# Patient Record
Sex: Female | Born: 1940 | Race: White | Hispanic: No | Marital: Married | Smoking: Never smoker
Health system: Southern US, Community
[De-identification: ages and names within clinical notes are randomized; demographics above are authoritative.]

## PROBLEM LIST (undated history)

## (undated) DIAGNOSIS — E118 Type 2 diabetes mellitus with unspecified complications: Secondary | ICD-10-CM

## (undated) DIAGNOSIS — J9610 Chronic respiratory failure, unspecified whether with hypoxia or hypercapnia: Secondary | ICD-10-CM

## (undated) DIAGNOSIS — I10 Essential (primary) hypertension: Secondary | ICD-10-CM

## (undated) DIAGNOSIS — E785 Hyperlipidemia, unspecified: Secondary | ICD-10-CM

## (undated) DIAGNOSIS — I4891 Unspecified atrial fibrillation: Secondary | ICD-10-CM

## (undated) HISTORY — PX: VAGINAL PROLAPSE REPAIR: SHX830

## (undated) HISTORY — PX: BACK SURGERY: SHX140

## (undated) HISTORY — PX: ANOMALOUS PULMONARY VENOUS RETURN REPAIR, TOTAL: SHX1156

---

## 2020-03-26 ENCOUNTER — Observation Stay (HOSPITAL_COMMUNITY): Payer: Medicare Other

## 2020-03-26 ENCOUNTER — Emergency Department (HOSPITAL_COMMUNITY): Payer: Medicare Other

## 2020-03-26 ENCOUNTER — Encounter (HOSPITAL_COMMUNITY): Payer: Self-pay | Admitting: Internal Medicine

## 2020-03-26 ENCOUNTER — Other Ambulatory Visit: Payer: Self-pay

## 2020-03-26 ENCOUNTER — Inpatient Hospital Stay (HOSPITAL_COMMUNITY)
Admission: EM | Admit: 2020-03-26 | Discharge: 2020-03-28 | DRG: 069 | Disposition: A | Discharge status: Home Health | Payer: Medicare Other | Attending: Internal Medicine | Admitting: Internal Medicine

## 2020-03-26 DIAGNOSIS — Z20822 Contact with and (suspected) exposure to covid-19: Secondary | ICD-10-CM | POA: Diagnosis present

## 2020-03-26 DIAGNOSIS — Z7982 Long term (current) use of aspirin: Secondary | ICD-10-CM

## 2020-03-26 DIAGNOSIS — Z880 Allergy status to penicillin: Secondary | ICD-10-CM

## 2020-03-26 DIAGNOSIS — B9620 Unspecified Escherichia coli [E. coli] as the cause of diseases classified elsewhere: Secondary | ICD-10-CM | POA: Diagnosis present

## 2020-03-26 DIAGNOSIS — G459 Transient cerebral ischemic attack, unspecified: Secondary | ICD-10-CM | POA: Diagnosis not present

## 2020-03-26 DIAGNOSIS — Z79899 Other long term (current) drug therapy: Secondary | ICD-10-CM

## 2020-03-26 DIAGNOSIS — I639 Cerebral infarction, unspecified: Secondary | ICD-10-CM | POA: Diagnosis present

## 2020-03-26 DIAGNOSIS — I340 Nonrheumatic mitral (valve) insufficiency: Secondary | ICD-10-CM

## 2020-03-26 DIAGNOSIS — Z833 Family history of diabetes mellitus: Secondary | ICD-10-CM

## 2020-03-26 DIAGNOSIS — R911 Solitary pulmonary nodule: Secondary | ICD-10-CM | POA: Diagnosis present

## 2020-03-26 DIAGNOSIS — I482 Chronic atrial fibrillation, unspecified: Secondary | ICD-10-CM | POA: Diagnosis not present

## 2020-03-26 DIAGNOSIS — Z888 Allergy status to other drugs, medicaments and biological substances status: Secondary | ICD-10-CM

## 2020-03-26 DIAGNOSIS — Z8249 Family history of ischemic heart disease and other diseases of the circulatory system: Secondary | ICD-10-CM

## 2020-03-26 DIAGNOSIS — R791 Abnormal coagulation profile: Secondary | ICD-10-CM | POA: Diagnosis not present

## 2020-03-26 DIAGNOSIS — K219 Gastro-esophageal reflux disease without esophagitis: Secondary | ICD-10-CM | POA: Diagnosis present

## 2020-03-26 DIAGNOSIS — I480 Paroxysmal atrial fibrillation: Secondary | ICD-10-CM | POA: Diagnosis present

## 2020-03-26 DIAGNOSIS — J9610 Chronic respiratory failure, unspecified whether with hypoxia or hypercapnia: Secondary | ICD-10-CM | POA: Diagnosis present

## 2020-03-26 DIAGNOSIS — Z952 Presence of prosthetic heart valve: Secondary | ICD-10-CM

## 2020-03-26 DIAGNOSIS — I361 Nonrheumatic tricuspid (valve) insufficiency: Secondary | ICD-10-CM

## 2020-03-26 DIAGNOSIS — E785 Hyperlipidemia, unspecified: Secondary | ICD-10-CM | POA: Diagnosis present

## 2020-03-26 DIAGNOSIS — E66813 Obesity, class 3: Secondary | ICD-10-CM | POA: Diagnosis present

## 2020-03-26 DIAGNOSIS — Z9981 Dependence on supplemental oxygen: Secondary | ICD-10-CM

## 2020-03-26 DIAGNOSIS — E118 Type 2 diabetes mellitus with unspecified complications: Secondary | ICD-10-CM | POA: Diagnosis present

## 2020-03-26 DIAGNOSIS — N390 Urinary tract infection, site not specified: Secondary | ICD-10-CM | POA: Diagnosis present

## 2020-03-26 DIAGNOSIS — R531 Weakness: Secondary | ICD-10-CM

## 2020-03-26 DIAGNOSIS — G9349 Other encephalopathy: Secondary | ICD-10-CM | POA: Diagnosis present

## 2020-03-26 DIAGNOSIS — I10 Essential (primary) hypertension: Secondary | ICD-10-CM | POA: Diagnosis present

## 2020-03-26 DIAGNOSIS — Z794 Long term (current) use of insulin: Secondary | ICD-10-CM

## 2020-03-26 DIAGNOSIS — Z7901 Long term (current) use of anticoagulants: Secondary | ICD-10-CM

## 2020-03-26 DIAGNOSIS — Z885 Allergy status to narcotic agent status: Secondary | ICD-10-CM

## 2020-03-26 DIAGNOSIS — Z6841 Body Mass Index (BMI) 40.0 and over, adult: Secondary | ICD-10-CM

## 2020-03-26 HISTORY — DX: Hyperlipidemia, unspecified: E78.5

## 2020-03-26 HISTORY — DX: Unspecified atrial fibrillation: I48.91

## 2020-03-26 HISTORY — DX: Type 2 diabetes mellitus with unspecified complications: E11.8

## 2020-03-26 HISTORY — DX: Morbid (severe) obesity due to excess calories: E66.01

## 2020-03-26 HISTORY — DX: Chronic respiratory failure, unspecified whether with hypoxia or hypercapnia: J96.10

## 2020-03-26 HISTORY — DX: Essential (primary) hypertension: I10

## 2020-03-26 LAB — DIFFERENTIAL
Abs Immature Granulocytes: 0.06 10*3/uL (ref 0.00–0.07)
Basophils Absolute: 0 10*3/uL (ref 0.0–0.1)
Basophils Relative: 0 %
Eosinophils Absolute: 0.3 10*3/uL (ref 0.0–0.5)
Eosinophils Relative: 3 %
Immature Granulocytes: 1 %
Lymphocytes Relative: 14 %
Lymphs Abs: 1.3 10*3/uL (ref 0.7–4.0)
Monocytes Absolute: 1 10*3/uL (ref 0.1–1.0)
Monocytes Relative: 10 %
Neutro Abs: 7.2 10*3/uL (ref 1.7–7.7)
Neutrophils Relative %: 72 %

## 2020-03-26 LAB — I-STAT CHEM 8, ED
BUN: 17 mg/dL (ref 8–23)
Calcium, Ion: 1.13 mmol/L — ABNORMAL LOW (ref 1.15–1.40)
Chloride: 98 mmol/L (ref 98–111)
Creatinine, Ser: 0.9 mg/dL (ref 0.44–1.00)
Glucose, Bld: 140 mg/dL — ABNORMAL HIGH (ref 70–99)
HCT: 39 % (ref 36.0–46.0)
Hemoglobin: 13.3 g/dL (ref 12.0–15.0)
Potassium: 3.9 mmol/L (ref 3.5–5.1)
Sodium: 140 mmol/L (ref 135–145)
TCO2: 32 mmol/L (ref 22–32)

## 2020-03-26 LAB — COMPREHENSIVE METABOLIC PANEL
ALT: 14 U/L (ref 0–44)
AST: 17 U/L (ref 15–41)
Albumin: 3.5 g/dL (ref 3.5–5.0)
Alkaline Phosphatase: 61 U/L (ref 38–126)
Anion gap: 12 (ref 5–15)
BUN: 13 mg/dL (ref 8–23)
CO2: 29 mmol/L (ref 22–32)
Calcium: 9.2 mg/dL (ref 8.9–10.3)
Chloride: 100 mmol/L (ref 98–111)
Creatinine, Ser: 1.07 mg/dL — ABNORMAL HIGH (ref 0.44–1.00)
GFR calc Af Amer: 58 mL/min — ABNORMAL LOW (ref >60)
GFR calc non Af Amer: 50 mL/min — ABNORMAL LOW (ref >60)
Glucose, Bld: 146 mg/dL — ABNORMAL HIGH (ref 70–99)
Potassium: 3.8 mmol/L (ref 3.5–5.1)
Sodium: 141 mmol/L (ref 135–145)
Total Bilirubin: 0.7 mg/dL (ref 0.3–1.2)
Total Protein: 6.7 g/dL (ref 6.5–8.1)

## 2020-03-26 LAB — ECHOCARDIOGRAM COMPLETE
AR max vel: 1.7 cm2
AV Area VTI: 1.39 cm2
AV Area mean vel: 1.51 cm2
AV Mean grad: 10 mmHg
AV Peak grad: 20.3 mmHg
Ao pk vel: 2.26 m/s
Area-P 1/2: 3.23 cm2
Calc EF: 64.1 %
Height: 59 in
S' Lateral: 1.9 cm
Single Plane A2C EF: 61.6 %
Single Plane A4C EF: 65.4 %
Weight: 3608.49 [oz_av]

## 2020-03-26 LAB — CBC
HCT: 41.5 % (ref 36.0–46.0)
Hemoglobin: 12.9 g/dL (ref 12.0–15.0)
MCH: 28.8 pg (ref 26.0–34.0)
MCHC: 31.1 g/dL (ref 30.0–36.0)
MCV: 92.6 fL (ref 80.0–100.0)
Platelets: 173 10*3/uL (ref 150–400)
RBC: 4.48 MIL/uL (ref 3.87–5.11)
RDW: 14.7 % (ref 11.5–15.5)
WBC: 9.9 10*3/uL (ref 4.0–10.5)
nRBC: 0 % (ref 0.0–0.2)

## 2020-03-26 LAB — URINALYSIS, ROUTINE W REFLEX MICROSCOPIC
Bilirubin Urine: NEGATIVE
Glucose, UA: NEGATIVE mg/dL
Hgb urine dipstick: NEGATIVE
Ketones, ur: NEGATIVE mg/dL
Nitrite: POSITIVE — AB
Protein, ur: NEGATIVE mg/dL
Specific Gravity, Urine: 1.034 — ABNORMAL HIGH (ref 1.005–1.030)
WBC, UA: >50 WBC/hpf — ABNORMAL HIGH (ref 0–5)
pH: 7 (ref 5.0–8.0)

## 2020-03-26 LAB — PROTIME-INR
INR: 3.5 — ABNORMAL HIGH (ref 0.8–1.2)
Prothrombin Time: 33.8 seconds — ABNORMAL HIGH (ref 11.4–15.2)

## 2020-03-26 LAB — GLUCOSE, CAPILLARY: Glucose-Capillary: 143 mg/dL — ABNORMAL HIGH (ref 70–99)

## 2020-03-26 LAB — CBG MONITORING, ED: Glucose-Capillary: 150 mg/dL — ABNORMAL HIGH (ref 70–99)

## 2020-03-26 LAB — SARS CORONAVIRUS 2 BY RT PCR (HOSPITAL ORDER, PERFORMED IN ~~LOC~~ HOSPITAL LAB): SARS Coronavirus 2: NEGATIVE

## 2020-03-26 LAB — HEMOGLOBIN A1C
Hgb A1c MFr Bld: 6.9 % — ABNORMAL HIGH (ref 4.8–5.6)
Mean Plasma Glucose: 151.33 mg/dL

## 2020-03-26 LAB — APTT: aPTT: 42 seconds — ABNORMAL HIGH (ref 24–36)

## 2020-03-26 MED ORDER — ROPINIROLE HCL 1 MG PO TABS
2.0000 mg | ORAL_TABLET | Freq: Once | ORAL | Status: AC
  Administered 2020-03-26: 2 mg via ORAL
  Filled 2020-03-26: qty 2

## 2020-03-26 MED ORDER — WHITE PETROLATUM EX OINT
TOPICAL_OINTMENT | CUTANEOUS | Status: AC
  Administered 2020-03-26: 0.2
  Filled 2020-03-26: qty 28.35

## 2020-03-26 MED ORDER — TORSEMIDE 20 MG PO TABS
30.0000 mg | ORAL_TABLET | Freq: Every morning | ORAL | Status: DC
  Administered 2020-03-27 – 2020-03-28 (×2): 30 mg via ORAL
  Filled 2020-03-26 (×2): qty 2

## 2020-03-26 MED ORDER — SODIUM CHLORIDE 0.9 % IV SOLN
INTRAVENOUS | Status: DC | PRN
  Administered 2020-03-26: 250 mL via INTRAVENOUS

## 2020-03-26 MED ORDER — ASPIRIN EC 81 MG PO TBEC
81.0000 mg | DELAYED_RELEASE_TABLET | Freq: Every day | ORAL | Status: DC
  Administered 2020-03-26 – 2020-03-28 (×3): 81 mg via ORAL
  Filled 2020-03-26 (×3): qty 1

## 2020-03-26 MED ORDER — INSULIN ASPART 100 UNIT/ML ~~LOC~~ SOLN: 0.0000 [IU] | Freq: Every day | SUBCUTANEOUS | Status: DC

## 2020-03-26 MED ORDER — ACETAMINOPHEN 325 MG PO TABS
650.0000 mg | ORAL_TABLET | ORAL | Status: DC | PRN
  Administered 2020-03-27: 650 mg via ORAL
  Filled 2020-03-26: qty 2

## 2020-03-26 MED ORDER — STROKE: EARLY STAGES OF RECOVERY BOOK: Freq: Once | Status: DC

## 2020-03-26 MED ORDER — SODIUM CHLORIDE 0.9% FLUSH: 3.0000 mL | Freq: Once | INTRAVENOUS | Status: DC

## 2020-03-26 MED ORDER — SENNOSIDES-DOCUSATE SODIUM 8.6-50 MG PO TABS: 1.0000 | ORAL_TABLET | Freq: Every evening | ORAL | Status: DC | PRN

## 2020-03-26 MED ORDER — ACETAMINOPHEN 160 MG/5ML PO SOLN: 650.0000 mg | ORAL | Status: DC | PRN

## 2020-03-26 MED ORDER — ACETAMINOPHEN 650 MG RE SUPP: 650.0000 mg | RECTAL | Status: DC | PRN

## 2020-03-26 MED ORDER — ATORVASTATIN CALCIUM 80 MG PO TABS
80.0000 mg | ORAL_TABLET | Freq: Every evening | ORAL | Status: DC
  Administered 2020-03-26 – 2020-03-27 (×2): 80 mg via ORAL
  Filled 2020-03-26 (×2): qty 1

## 2020-03-26 MED ORDER — SODIUM CHLORIDE 0.9 % IV SOLN
1.0000 g | INTRAVENOUS | Status: DC
  Administered 2020-03-26 – 2020-03-27 (×2): 1 g via INTRAVENOUS
  Filled 2020-03-26 (×2): qty 10

## 2020-03-26 MED ORDER — INSULIN ASPART 100 UNIT/ML ~~LOC~~ SOLN
0.0000 [IU] | Freq: Three times a day (TID) | SUBCUTANEOUS | Status: DC
  Administered 2020-03-27: 3 [IU] via SUBCUTANEOUS
  Administered 2020-03-27: 5 [IU] via SUBCUTANEOUS
  Administered 2020-03-27: 3 [IU] via SUBCUTANEOUS
  Administered 2020-03-28: 5 [IU] via SUBCUTANEOUS
  Administered 2020-03-28: 2 [IU] via SUBCUTANEOUS

## 2020-03-26 MED ORDER — IOHEXOL 350 MG/ML SOLN
75.0000 mL | Freq: Once | INTRAVENOUS | Status: AC | PRN
  Administered 2020-03-26: 75 mL via INTRAVENOUS

## 2020-03-26 MED ORDER — INSULIN ISOPHANE HUMAN 100 UNIT/ML KWIKPEN
23.0000 [IU] | PEN_INJECTOR | SUBCUTANEOUS | Status: DC
Start: 2020-03-26 — End: 2020-03-26

## 2020-03-26 MED ORDER — ENSURE ENLIVE PO LIQD
237.0000 mL | Freq: Two times a day (BID) | ORAL | Status: DC
  Administered 2020-03-26 – 2020-03-27 (×3): 237 mL via ORAL
  Filled 2020-03-26 (×2): qty 237

## 2020-03-26 MED ORDER — LORATADINE 10 MG PO TABS
10.0000 mg | ORAL_TABLET | Freq: Every day | ORAL | Status: DC
  Administered 2020-03-26 – 2020-03-28 (×3): 10 mg via ORAL
  Filled 2020-03-26 (×3): qty 1

## 2020-03-26 MED ORDER — DILTIAZEM HCL ER COATED BEADS 240 MG PO CP24
240.0000 mg | ORAL_CAPSULE | Freq: Every day | ORAL | Status: DC
  Administered 2020-03-27 – 2020-03-28 (×2): 240 mg via ORAL
  Filled 2020-03-26 (×2): qty 1

## 2020-03-26 MED ORDER — WARFARIN - PHARMACIST DOSING INPATIENT: Freq: Every day | Status: DC

## 2020-03-26 MED ORDER — PANTOPRAZOLE SODIUM 40 MG PO TBEC
40.0000 mg | DELAYED_RELEASE_TABLET | Freq: Every day | ORAL | Status: DC
  Administered 2020-03-26 – 2020-03-28 (×3): 40 mg via ORAL
  Filled 2020-03-26 (×3): qty 1

## 2020-03-26 MED ORDER — ROPINIROLE HCL 1 MG PO TABS
2.0000 mg | ORAL_TABLET | Freq: Two times a day (BID) | ORAL | Status: DC
  Administered 2020-03-26 – 2020-03-28 (×5): 2 mg via ORAL
  Filled 2020-03-26 (×7): qty 2

## 2020-03-26 MED ORDER — METOPROLOL SUCCINATE ER 100 MG PO TB24
100.0000 mg | ORAL_TABLET | Freq: Two times a day (BID) | ORAL | Status: DC
  Administered 2020-03-26 – 2020-03-28 (×4): 100 mg via ORAL
  Filled 2020-03-26 (×4): qty 1

## 2020-03-26 MED ORDER — TORSEMIDE 20 MG PO TABS
20.0000 mg | ORAL_TABLET | Freq: Every day | ORAL | Status: DC
  Administered 2020-03-27: 20 mg via ORAL
  Filled 2020-03-26 (×2): qty 1

## 2020-03-26 MED ORDER — OXYCODONE-ACETAMINOPHEN 5-325 MG PO TABS
1.0000 | ORAL_TABLET | Freq: Four times a day (QID) | ORAL | Status: DC | PRN
  Administered 2020-03-27 – 2020-03-28 (×3): 1 via ORAL
  Filled 2020-03-26 (×3): qty 1

## 2020-03-26 NOTE — Code Documentation (Signed)
Stroke Response Nurse Documentation Code Documentation  Jennifer Lowe is a 79 y.o. female arriving to [facility] ED via [facility] on 03/26/2020. Code stroke was activated by EMS. Patient from home where she was LKW at 0500. Pt reports saying up all night due to leg pain. At 0500 she started to notice that her right side was weak and at 0600 she notified her family. On warfarin daily. Stroke team at the bedside on patient arrival. Labs drawn and patient cleared for CT by EDP. Patient to CT with team. NIHSS 2, see documentation for details and code stroke times. Patient with left arm weakness and right leg weakness on exam. The following imaging was completed: CT, CTA head and neck. Patient is not a candidate for tPA due to being on Warfarin with INR 3.5. Care/Plan: Pt to have completed Q2 NIHSS/VS and stroke workup. Bedside handoff with ED RN Leeroy Bock.    Jennifer Lowe  Stroke Response RN

## 2020-03-26 NOTE — Progress Notes (Signed)
ANTICOAGULATION CONSULT NOTE - Initial Consult  Pharmacy Consult for warfarin Indication: atrial fibrillation  Allergies  Allergen Reactions  . Amlodipine Besylate-Valsartan Other (See Comments)    unknown  . Morphine And Related Itching  . Amoxicillin Nausea And Vomiting  . Doxycycline Hyclate Rash    Patient Measurements: Height: 4\' 11"  (149.9 cm) Weight: 102.3 kg (225 lb 8.5 oz) IBW/kg (Calculated) : 43.2  Vital Signs: Temp: 98.2 F (36.8 C) (09/03 0915) Temp Source: Oral (09/03 0915) BP: 137/58 (09/03 1400) Pulse Rate: 88 (09/03 1400)  Labs: Recent Labs    03/26/20 0858 03/26/20 0903  HGB 12.9 13.3  HCT 41.5 39.0  PLT 173  --   APTT 42*  --   LABPROT 33.8*  --   INR 3.5*  --   CREATININE 1.07* 0.90    Estimated Creatinine Clearance: 54.3 mL/min (by C-G formula based on SCr of 0.9 mg/dL).  Assessment: 45 yof presented to the ED as a Code Stroke. No acute abnormalities seen on CT. She is on chronic warfarin for hx afib. INR is supratherapeutic at 3.5. No bleeding noted. CBC is WNL.   Goal of Therapy:  INR 2-3 Monitor platelets by anticoagulation protocol: Yes   Plan:  No warfarin today Daily INR  Kaylen Motl, 71 03/26/2020,2:25 PM

## 2020-03-26 NOTE — Code Documentation (Signed)
MD went into assess patient and facial note present on the left sided. MD aware and no change in plan of care at this time.

## 2020-03-26 NOTE — Plan of Care (Signed)
  Problem: Activity: Goal: Risk for activity intolerance will decrease Outcome: Progressing   Problem: Nutrition: Goal: Adequate nutrition will be maintained Outcome: Progressing   Problem: Coping: Goal: Level of anxiety will decrease Outcome: Progressing   

## 2020-03-26 NOTE — ED Triage Notes (Signed)
Pt presents to ED as a code stroke from home. Pt reports that she started feeling "bad" around 0500 this morning, but started having lower lip numbness, slurred speech, right arm and leg weakness at 0600. Pt reports having tremor to right arm and leg at baseline. No obvious facial droop.   Pt takes warfarin for Afib.  22G LFA

## 2020-03-26 NOTE — H&P (Signed)
History and Physical    Jennifer Lowe UXN:[id number] DOB: Jan 07, 1941 DOA: 03/26/2020  Referring MD/NP/PA: Cherlynn Perches, MD PCP: No primary care provider on file.  Patient coming from: Home via EMS  Chief Complaint: Right sided weakness  I have personally briefly reviewed patient's old medical records in [facility]   HPI: Jennifer Lowe is a 79 y.o. female with medical history significant of atrial fibrillation on Coumadin, chronic respiratory failure on 3 L, s/p TAVR, hypertension, hyperlipidemia, diabetes mellitus type 2, GERD, and morbid obesity presented with complaints of right-sided weakness.  She reported feeling bad around 5 AM this morning.  Soon thereafter noticed having right sided weakness, numbness, and slurred speech.  Denied any significant changes in her vision, palpitations, headache, nausea, vomiting, abdominal pain, or diarrhea.  She previously has had back surgeries performed to L4- 5 for which she has been told that she cannot have a MRI.  ED Course: Upon admission into the emergency department patient was seen as a code stroke.  Evaluated by neurology and initial CT scan of the brain negative for any acute abnormalities.  Not a candidate for TPA due to being on anticoagulation.  She was noted to be afebrile in atrial fibrillation with heart rates up to 103, respirations 23, and all other vital signs relatively maintained.  Labs significant for creatinine 1.07, glucose 146, and INR 3.5.  Not a candidate for MRI due to implanted device.  TRH called to admit for completion of stroke work-up.  Review of Systems  Constitutional: Positive for malaise/fatigue. Negative for fever.  HENT: Negative for congestion and nosebleeds.   Eyes: Negative for photophobia and pain.  Respiratory: Negative for cough and shortness of breath.   Cardiovascular: Positive for leg swelling. Negative for chest pain.  Gastrointestinal: Negative for abdominal pain, nausea and vomiting.    Genitourinary: Negative for dysuria and hematuria.  Musculoskeletal: Positive for back pain. Negative for falls.  Skin: Negative for rash.  Neurological: Positive for speech change and focal weakness.  Psychiatric/Behavioral: Negative for substance abuse. The patient has insomnia.     Past Medical History:  Diagnosis Date  . Atrial fibrillation (HCC)   . Chronic respiratory failure (HCC)   . Diabetes mellitus type 2 with complications (HCC)   . Hyperlipidemia   . Hypertension   . Morbid obesity (HCC)     Past Surgical History:  Procedure Laterality Date  . BACK SURGERY     L4-5  . VAGINAL PROLAPSE REPAIR       has no history on file for tobacco use, alcohol use, and drug use.  Allergies  Allergen Reactions  . Amlodipine Besylate-Valsartan Other (See Comments)    unknown  . Morphine And Related Itching  . Amoxicillin Nausea And Vomiting  . Doxycycline Hyclate Rash    Family History  Problem Relation Age of Onset  . Hypertension Mother   . Diabetes Mother   . Hypertension Sister   . Diabetes Sister   . Hypertension Brother   . Diabetes Brother    No current facility-administered medications on file prior to encounter.   Current Outpatient Medications on File Prior to Encounter  Medication Sig Dispense Refill  . acetaminophen (TYLENOL) 325 MG tablet Take 650 mg by mouth every 6 (six) hours as needed for mild pain.    Marland Kitchen aspirin EC 81 MG tablet Take 81 mg by mouth daily. Swallow whole.    Marland Kitchen atorvastatin (LIPITOR) 80 MG tablet Take 80 mg by mouth every evening.    Marland Kitchen  Cholecalciferol (VITAMIN D3 PO) Take 1 tablet by mouth daily.    Marland Kitchen diltiazem (CARDIZEM CD) 240 MG 24 hr capsule Take 240 mg by mouth daily.    . fexofenadine (ALLEGRA) 180 MG tablet Take 180 mg by mouth daily.    . Fluticasone-Umeclidin-Vilant (TRELEGY ELLIPTA IN) Inhale 1 puff into the lungs daily as needed (shortness of breath).     Marland Kitchen KLOR-CON M20 20 MEQ tablet Take 20 mEq by mouth daily.    .  metoprolol succinate (TOPROL-XL) 100 MG 24 hr tablet Take 100 mg by mouth 2 (two) times daily.    Marland Kitchen NOVOLIN N FLEXPEN 100 UNIT/ML Kiwkpen Inject 23-30 Units into the skin See admin instructions. Inject 30 units in the morning and 23 units at night    . omeprazole (PRILOSEC) 40 MG capsule Take 40 mg by mouth daily.     Marland Kitchen oxyCODONE-acetaminophen (PERCOCET/ROXICET) 5-325 MG tablet Take 1 tablet by mouth every 6 (six) hours as needed for moderate pain.     Marland Kitchen rOPINIRole (REQUIP) 2 MG tablet Take 2 mg by mouth 2 (two) times daily.    Marland Kitchen torsemide (DEMADEX) 20 MG tablet Take 20-30 mg by mouth See admin instructions. Take 30 mg in the morning and 20 mg at 2 pm    . VITAMIN E PO Take 1 capsule by mouth daily.    Marland Kitchen warfarin (COUMADIN) 5 MG tablet Take 2.5-5 mg by mouth See admin instructions. 2.5mg  Fri, Mon      Physical Exam:  Constitutional: Morbidly obese female who appears to be in no acute distress Vitals:   03/26/20 0800 03/26/20 0850 03/26/20 0915 03/26/20 0925  BP:   (!) 128/97 126/86  Pulse:   73 (!) 103  Resp:    (!) 23  Temp:   98.2 F (36.8 C)   TempSrc:   Oral   SpO2:  96% 100% 100%  Weight: 102.3 kg  102.3 kg   Height:   4\' 11"  (1.499 m)    Eyes: PERRL, lids and conjunctivae normal ENMT: Mucous membranes are moist. Posterior pharynx clear of any exudate or lesions.  Neck: normal, supple, no masses, no thyromegaly Respiratory: clear to auscultation bilaterally, no wheezing, no crackles. Normal respiratory effort. No accessory muscle use.  Cardiovascular: Irregularly irregular, no murmurs / rubs / gallops.  1+ pitting lower extremity edema. 2+ pedal pulses. No carotid bruits.  Abdomen: no tenderness, no masses palpated. No hepatosplenomegaly. Bowel sounds positive.  Musculoskeletal: no clubbing / cyanosis. No joint deformity upper and lower extremities. Good ROM, no contractures. Normal muscle tone.  Skin: no rashes, lesions, ulcers. No induration Neurologic: CN 2-12 grossly intact.  Sensation intact, DTR normal.  4+/5 on the right lower extremity.  All other extremities appear to be 5/ 5.  Speech still appears to be intermittently slurred Psychiatric: Normal judgment and insight. Alert and oriented x 3. Normal mood.     Labs on Admission: I have personally reviewed following labs and imaging studies  CBC: Recent Labs  Lab 03/26/20 0858 03/26/20 0903  WBC 9.9  --   NEUTROABS 7.2  --   HGB 12.9 13.3  HCT 41.5 39.0  MCV 92.6  --   PLT 173  --    Basic Metabolic Panel: Recent Labs  Lab 03/26/20 0858 03/26/20 0903  NA 141 140  K 3.8 3.9  CL 100 98  CO2 29  --   GLUCOSE 146* 140*  BUN 13 17  CREATININE 1.07* 0.90  CALCIUM 9.2  --  GFR: Estimated Creatinine Clearance: 54.3 mL/min (by C-G formula based on SCr of 0.9 mg/dL). Liver Function Tests: Recent Labs  Lab 03/26/20 0858  AST 17  ALT 14  ALKPHOS 61  BILITOT 0.7  PROT 6.7  ALBUMIN 3.5   No results for input(s): LIPASE, AMYLASE in the last 168 hours. No results for input(s): AMMONIA in the last 168 hours. Coagulation Profile: Recent Labs  Lab 03/26/20 0858  INR 3.5*   Cardiac Enzymes: No results for input(s): CKTOTAL, CKMB, CKMBINDEX, TROPONINI in the last 168 hours. BNP (last 3 results) No results for input(s): PROBNP in the last 8760 hours. HbA1C: No results for input(s): HGBA1C in the last 72 hours. CBG: Recent Labs  Lab 03/26/20 0849  GLUCAP 150*   Lipid Profile: No results for input(s): CHOL, HDL, LDLCALC, TRIG, CHOLHDL, LDLDIRECT in the last 72 hours. Thyroid Function Tests: No results for input(s): TSH, T4TOTAL, FREET4, T3FREE, THYROIDAB in the last 72 hours. Anemia Panel: No results for input(s): VITAMINB12, FOLATE, FERRITIN, TIBC, IRON, RETICCTPCT in the last 72 hours. Urine analysis: No results found for: COLORURINE, APPEARANCEUR, LABSPEC, PHURINE, GLUCOSEU, HGBUR, BILIRUBINUR, KETONESUR, PROTEINUR, UROBILINOGEN, NITRITE, LEUKOCYTESUR Sepsis Labs: No results found  for this or any previous visit (from the past 240 hour(s)).   Radiological Exams on Admission: CT Code Stroke CTA Head W/WO contrast  Result Date: 03/26/2020 CLINICAL DATA:  Left-sided weakness.  Stroke suspected. EXAM: CT HEAD WITHOUT CONTRAST CT ANGIOGRAPHY OF THE HEAD AND NECK TECHNIQUE: Contiguous axial images were obtained from the base of the skull through the vertex without intravenous contrast. Multidetector CT imaging of the head and neck was performed using the standard protocol during bolus administration of intravenous contrast. Multiplanar CT image reconstructions and MIPs were obtained to evaluate the vascular anatomy. Carotid stenosis measurements (when applicable) are obtained utilizing NASCET criteria, using the distal internal carotid diameter as the denominator. CONTRAST:  75mL OMNIPAQUE IOHEXOL 350 MG/ML SOLN COMPARISON:  CT head 01/23/2020 FINDINGS: CTA NECK Aortic arch: Calcific atherosclerosis. Right carotid system: No evidence significant (greater than 50%)w stenosis or occlusion Left carotid system: Predominant calcific atherosclerosis at the bifurcation without greater than 50% narrowing. Vertebral arteries:Left dominant. The right vertebral artery is diminutive throughout its course and likely terminates as PICA. No significant stenosis of the left vertebral artery. Mild narrowing of the left vertebral artery secondary to mass effect from degenerative osteophytes. Skeleton: Multilevel degenerative changes of the cervical spine with severe degenerative disc changes at C5-C6 and C6-C7 and severe facet arthropathy at C4-C5 with degenerative anterolisthesis of C4 on C5. Degenerative pannus at the craniocervical junction. Other neck: The right thyroid gland is atrophic or absent. CTA HEAD Anterior circulation: No evidence of significant (greater than 50%) stenosis or occlusion. No aneurysm. Posterior circulation: No evidence of significant (greater than 50%) stenosis or occlusion. No  aneurysm. Venous sinuses: No evidence of dural sinus thrombosis, although evaluation is limited given arterial timing. Anatomic variants: The right ACA appears to bifurcate and there is no appreciable anterior communicating artery IMPRESSION: 1. No evidence of significant (greater than 50%) stenosis or occlusion in the head or neck. 2. Approximately 7 mm pulmonary nodule within the visualized right upper lobe. This may be infectious/inflammatory given mild surrounding opacities; however, non-contrast chest CT at 6-12 months is recommended to ensure resolution or stability. If the nodule is stable at time of repeat CT, then future CT at 18-24 months (from today's scan) is considered optional for low-risk patients, but is recommended for high-risk patients. This recommendation  follows the consensus statement: Guidelines for Management of Incidental Pulmonary Nodules Detected on CT Images: From the Fleischner Society 2017; Radiology 2017; 284:228-243. Code stroke imaging results were communicated on 03/26/2020 at 9:11 am to provider Dr. Ezzie DuralSal via telephone, who verbally acknowledged these results. Pulmonary nodule finding was communicated to Dr. Ezzie DuralSal at 9:30 am. Electronically Signed   By: Feliberto HartsFrederick S Jones MD   On: 03/26/2020 09:35   CT Code Stroke CTA Neck W/WO contrast  Result Date: 03/26/2020 CLINICAL DATA:  Left-sided weakness.  Stroke suspected. EXAM: CT HEAD WITHOUT CONTRAST CT ANGIOGRAPHY OF THE HEAD AND NECK TECHNIQUE: Contiguous axial images were obtained from the base of the skull through the vertex without intravenous contrast. Multidetector CT imaging of the head and neck was performed using the standard protocol during bolus administration of intravenous contrast. Multiplanar CT image reconstructions and MIPs were obtained to evaluate the vascular anatomy. Carotid stenosis measurements (when applicable) are obtained utilizing NASCET criteria, using the distal internal carotid diameter as the denominator.  CONTRAST:  75mL OMNIPAQUE IOHEXOL 350 MG/ML SOLN COMPARISON:  CT head 01/23/2020 FINDINGS: CTA NECK Aortic arch: Calcific atherosclerosis. Right carotid system: No evidence significant (greater than 50%)w stenosis or occlusion Left carotid system: Predominant calcific atherosclerosis at the bifurcation without greater than 50% narrowing. Vertebral arteries:Left dominant. The right vertebral artery is diminutive throughout its course and likely terminates as PICA. No significant stenosis of the left vertebral artery. Mild narrowing of the left vertebral artery secondary to mass effect from degenerative osteophytes. Skeleton: Multilevel degenerative changes of the cervical spine with severe degenerative disc changes at C5-C6 and C6-C7 and severe facet arthropathy at C4-C5 with degenerative anterolisthesis of C4 on C5. Degenerative pannus at the craniocervical junction. Other neck: The right thyroid gland is atrophic or absent. CTA HEAD Anterior circulation: No evidence of significant (greater than 50%) stenosis or occlusion. No aneurysm. Posterior circulation: No evidence of significant (greater than 50%) stenosis or occlusion. No aneurysm. Venous sinuses: No evidence of dural sinus thrombosis, although evaluation is limited given arterial timing. Anatomic variants: The right ACA appears to bifurcate and there is no appreciable anterior communicating artery IMPRESSION: 1. No evidence of significant (greater than 50%) stenosis or occlusion in the head or neck. 2. Approximately 7 mm pulmonary nodule within the visualized right upper lobe. This may be infectious/inflammatory given mild surrounding opacities; however, non-contrast chest CT at 6-12 months is recommended to ensure resolution or stability. If the nodule is stable at time of repeat CT, then future CT at 18-24 months (from today's scan) is considered optional for low-risk patients, but is recommended for high-risk patients. This recommendation follows the  consensus statement: Guidelines for Management of Incidental Pulmonary Nodules Detected on CT Images: From the Fleischner Society 2017; Radiology 2017; 284:228-243. Code stroke imaging results were communicated on 03/26/2020 at 9:11 am to provider Dr. Ezzie DuralSal via telephone, who verbally acknowledged these results. Pulmonary nodule finding was communicated to Dr. Ezzie DuralSal at 9:30 am. Electronically Signed   By: Feliberto HartsFrederick S Jones MD   On: 03/26/2020 09:35   CT HEAD CODE STROKE WO CONTRAST  Result Date: 03/26/2020 CLINICAL DATA:  Code stroke.  Right-sided arm/leg weakness. EXAM: CT HEAD WITHOUT CONTRAST TECHNIQUE: Contiguous axial images were obtained from the base of the skull through the vertex without intravenous contrast. COMPARISON:  CT head 01/23/2020 FINDINGS: Brain: No evidence of acute large vascular territory infarct. No acute hemorrhage. Moderate subcortical and periventricular hypodensity, nonspecific but likely the sequela of chronic microvascular ischemic disease. Similar cerebral  volume loss with ex vacuo ventricular dilation. No hydrocephalus. 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 Otherwise, no mass lesion or abnormal mass effect. Vascular: Calcific atherosclerosis.  No hyperdense vessel. Skull: Normal. Negative for fracture or focal lesion. Sinuses/Orbits: No acute finding. Other: None. ASPECTS ([place] Stroke Program Early CT Score): 10 IMPRESSION: 1. No acute intracranial abnormality. Specifically, no acute hemorrhage or evidence of acute large vascular territory infarct. 2. 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 3. ASPECTS is 10 Code stroke imaging results were communicated on 03/26/2020 at 9:11 am to provider Dr. Ezzie Dural via telephone, who verbally acknowledged these results. Electronically Signed   By: Feliberto Harts MD   On: 03/26/2020 09:15    EKG:  Independently reviewed.  Atrial fibrillation at 97 bpm  Assessment/Plan TIA versus stroke: Acute.  Patient presented with complaints of right-sided weakness and slurred speech.  Initial evaluation with CT scan of the brain and CT angiogram of the head and neck did not show any acute signs of a stroke or large vessel occlusion.  Not a TPA candidate due to being on anticoagulation. -Admit to telemetry bed -Stroke order set initiated -Neuro checks -Check echocardiogram -Check Hemoglobin A1c and lipid panel in a.m. -PT/OT/Speech consulted -Continue current regimen of aspirin and statin -Appreciate neurology consultative services, will follow-up  -Social work consulted  Urinary tract infection: Acute.  Urinalysis was positive moderate leukocytes, positive nitrates, few bacteria, and greater than 50 WBCs. -Check urine culture -Empiric antibiotics of Rocephin IV  Chronic atrial fibrillation with supratherapeutic INR: Acute.  INR 3.5 on admission.  Currently rate controlled. CHA2DS2-VASc score = 6 -Resume Cardizem and metoprolol  -Coumadin per pharmacy  Insulin-dependent diabetes mellitus: Home medications include Novolin 30 units every morning and 23 units nightly. -Hypoglycemic protocol -Follow-up hemoglobin A1c -CBGs before every meal and at bedtime with moderate SSI  -Adjust regimen as needed  History of TAVR  Pulmonary nodule: Incidentally found on CT angiogram of the head and neck noted to be 7 mm.   Hyperlipidemia: Home medications include atorvastatin 80 mg daily. -Follow-up lipid panel -Continue atorvastatin   Morbid obesity: BMI 45.55 kg/m -Continue talks on need of weight loss  DVT prophylaxis:: Coumadin Code Status: Full Family Communication: None requested Disposition Plan: Possible discharge home in 1-2 days Consults called: Neurology Admission status: Inpatient  Clydie Braun MD [facility] Pager [phone]   If 7PM-7AM, please contact  night-coverage [email or web address] Password [facility]  03/26/2020, 10:32 AM

## 2020-03-26 NOTE — Progress Notes (Signed)
  Echocardiogram 2D Echocardiogram has been performed.  Janalyn Harder 03/26/2020, 2:26 PM

## 2020-03-26 NOTE — Consult Note (Signed)
NEUROLOGY CONSULTATION NOTE   Date of service: March 26, 2020 Patient Name: Jennifer Lowe MRN:  [id number] DOB:  1941-04-30 Reason for consult: "stroke code"  History of Present Illness  Jennifer Lowe is a 79 y.o. 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  Patient did not sleep all night. Reported feeling funny and symptoms starting around 5AM with some shaking of her R side. She endorses that everytime she is tired or sick, she has weakness of the R arm and leg with tremoring and her son Jennifer Lowe also tells me the same. This started after her C spine surgery several years ago. She also has a left facial droop.  Workup with CTH with no acute intracranial abnormlity. ASPECTS of 10. INR elevated to 3.5 on Warfarin and so not a candidate for tPA. No LVO, so not a candidate for thrombectomy.  NIHSS: 4 for L facial droop, L arm drift, R leg weakness and R arm ataxia. MRS: premorbid mRS of 1. TPA: not given, takes warfarin at home with elevated INR Thrombectomy: no LVO.   ROS   Constitutional Denies weight loss, fever and chills.  HEENT Denies changes in vision and hearing.  Respiratory Denies SOB and cough.  CV Denies palpitations and CP  GI Denies abdominal pain, nausea, vomiting and diarrhea.  GU Denies dysuria and urinary frequency.  MSK Denies myalgia and joint pain.  Skin Denies rash and pruritus.  Neurological Denies headache and syncope.  Psychiatric Denies recent changes in mood. Denies anxiety and depression.   Past History  No past medical history on file. The histories are not reviewed yet. Please review them in the "History" navigator section and refresh this SmartLink.  No family history on file. Social History   Socioeconomic History   Marital status: Not on file    Spouse name: Not on file   Number of children: Not on file   Years of  education: Not on file   Highest education level: Not on file  Occupational History   Not on file  Tobacco Use   Smoking status: Not on file  Substance and Sexual Activity   Alcohol use: Not on file   Drug use: Not on file   Sexual activity: Not on file  Other Topics Concern   Not on file  Social History Narrative   Not on file   Social Determinants of Health   Financial Resource Strain:    Difficulty of Paying Living Expenses: Not on file  Food Insecurity:    Worried About Running Out of Food in the Last Year: Not on file   Ran Out of Food in the Last Year: Not on file  Transportation Needs:    Lack of Transportation (Medical): Not on file   Lack of Transportation (Non-Medical): Not on file  Physical Activity:    Days of Exercise per Week: Not on file   Minutes of Exercise per Session: Not on file  Stress:    Feeling of Stress : Not on file  Social Connections:    Frequency of Communication with Friends and Family: Not on file   Frequency of Social Gatherings with Friends and Family: Not on file   Attends Religious Services: Not on file   Active Member of Clubs or Organizations: Not on file   Attends Banker Meetings: Not on file   Marital Status: Not  on file   Allergies  Allergen Reactions   Amlodipine Besylate-Valsartan Other (See Comments)    unknown   Morphine And Related Itching   Amoxicillin Nausea And Vomiting   Doxycycline Hyclate Rash    Medications  (Not in a hospital admission)    Vitals  Temp:  [98.2 F (36.8 C)] 98.2 F (36.8 C) (09/03 0915) Pulse Rate:  [73-103] 103 (09/03 0925) Resp:  [23] 23 (09/03 0925) BP: (126-128)/(86-97) 126/86 (09/03 0925) SpO2:  [96 %-100 %] 100 % (09/03 0925) Weight:  [102.3 kg] 102.3 kg (09/03 0915)  Body mass index is 45.55 kg/m.  Physical Exam   General: Laying comfortably in bed; in no acute distress.  HENT: Normal oropharynx and mucosa. Normal external appearance of  ears and nose. Neck: Supple, no pain or tenderness CV: No JVD. No peripheral edema. Pulmonary: Symmetric Chest rise. Normal respiratory effort. Abdomen: Soft to touch, non-tender Ext: No cyanosis, edema, or deformity  Skin: No rash. Normal palpation of skin.   Musculoskeletal: Normal digits and nails by inspection. No clubbing.  Neurologic Examination  Mental status/Cognition: Alert, oriented to self, place, month and year, good attention. Speech/language: Mildly dysarthric speech, fluent, comprehension intact, object naming intact, repetition intact. Cranial nerves:   CN II Pupils equal and reactive to light, no VF deficits   CN III,IV,VI EOM intact, no gaze preference or deviation, no nystagmus   CN V normal sensation in V1, V2, and V3 segments bilaterally   CN VII Mild L facial droop.   CN VIII normal hearing to speech   CN IX & X normal palatal elevation, no uvular deviation   CN XI 5/5 head turn and 5/5 shoulder shrug bilaterally   CN XII midline tongue protrusion   Motor:  Muscle bulk: normal, tone normal, pronator drift L arm drift Mvmt Root Nerve  Muscle Right Left Comments  SA C5/6 Ax Deltoid 5 5   EF C5/6 Mc Biceps 5 5   EE C6/7/8 Rad Triceps 5 5   WF C6/7 Med FCR 5 5   WE C7/8 PIN ECU 5 5   F Ab C8/T1 U ADM/FDI 4 5   HF L1/2/3 Fem Illopsoas 3 5   KE L2/3/4 Fem Quad     DF L4/5 D Peron Tib Ant 4 5   PF S1/2 Tibial Grc/Sol 4 5    Reflexes:  Right Left Comments  Pectoralis      Biceps (C5/6) 1 1   Brachioradialis (C5/6) 1 1    Triceps (C6/7) 1 1    Patellar (L3/4) 1 1    Achilles (S1) 1 1    Hoffman      Plantar mute mute   Jaw jerk    Sensation:  Light touch intact   Pin prick inatct   Temperature    Vibration   Proprioception    Coordination/Complex Motor:  - Finger to Nose with ataxia in RUE and slowing - Heel to shin intact BL - Rapid alternating movement slowed in RUE - Gait: deferred.  Labs   Lab Results  Component Value Date   NA 140  03/26/2020   K 3.9 03/26/2020   CL 98 03/26/2020   CO2 29 03/26/2020   GLUCOSE 140 (H) 03/26/2020   BUN 17 03/26/2020   CREATININE 0.90 03/26/2020   CALCIUM 9.2 03/26/2020   ALBUMIN 3.5 03/26/2020   AST 17 03/26/2020   ALT 14 03/26/2020   ALKPHOS 61 03/26/2020   BILITOT 0.7 03/26/2020   GFRNONAA 50 (  L) 03/26/2020   GFRAA 58 (L) 03/26/2020     Imaging and Diagnostic studies  CTH without contrast: 1. No acute intracranial abnormality. Specifically, no acute hemorrhage or evidence of acute large vascular territory infarct. 2. 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 3. ASPECTS is 10  CT Angio Head and Neck: 1. No evidence of significant (greater than 50%) stenosis or occlusion in the head or neck. 2. Approximately 7 mm pulmonary nodule within the visualized right upper lobe. This may be infectious/inflammatory given mild surrounding opacities; however, non-contrast chest CT at 6-12 months is recommended to ensure resolution or stability. If the nodule is stable at time of repeat CT, then future CT at 18-24 months (from today's scan) is considered optional for low-risk patients, but is recommended for high-risk patients.   Impression   Jennifer Lowe is a 79 y.o. 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 She endorses baseline R sided weakness after C spine surgery but endorses acute on chronic weakness. It is not unusual for her to have weakness of the R side when she is tired or sick. Neuro exam with RUEand RLE weakness but LUE drift with L facial droop and RUE ataxia. CTH with no large territory infarct, no ICH. No LVO on CTA.  Not a tPA candidate 2/2 warfarin, no LVO on CTA, therefore not a candidate for thrombectomy. My primary suspicion is that this is either a small stroke in brainstem or  this is recrudescence of prior symptoms in the setting of lethargy and not sleeping well. I don't think this is from her Cervical spine. If it was, it would not explain L facial droop.  Recommendations  - Recommend MRI Brain without contrast, if concerning for a stroke, recommend completion of stroke workup with TTE, LDL, HbA1c. - INR is 3.5 and supratherapeutic. Per recent cards notes, goal INR is 2.0 - 3.0. Hold off on Warfarin to let it trend down to goal. - No antithrombotic given patient is on warfarin with supratherapeutic INR. - PT/OT eval for home safety given RLE weakness. - Will defer eval of the noted pulmonary nodule on CTA to medicine team. ______________________________________________________________________   Thank you for the opportunity to take part in the care of this patient. If you have any further questions, please contact the neurology consultation attending.  Signed,  Erick Blinks [facility] Pager Number [phone]

## 2020-03-26 NOTE — ED Notes (Signed)
Left side facial droop noted at this time. Neurology at bedside.

## 2020-03-26 NOTE — ED Notes (Signed)
Pt's family noted L eye is getting more red. Pt denies pain to eye, just says it feels like a pressure.

## 2020-03-26 NOTE — Evaluation (Signed)
Physical Therapy Evaluation Patient Details Name: Jennifer Lowe MRN: [id number] DOB: 02-Jul-1941 Today's Date: 03/26/2020   History of Present Illness  79yo female presenting with R LE weakness and L facial droop. CTH negative, MRI pending. No tpa given. Admited for further work-up. PMH obesity, A-fib, TAVR, HTN, DM, HLD  Clinical Impression   Patient received in bed, very pleasant and cooperative with PT today. See below for mobility/assist levels- overall very mobile at a supervision level at eval. Did have some safety concerns with RW and overall sequencing, but overall she was steady and stable on her feet. No ataxia or significant unilateral strength deficits noted with gait and functional mobility (but not specifically tested). Easily fatigued but reports this is her baseline. Did become very SOB with gait in room, PT did not have working pulse ox available to assess but will plan for this next session. Left in bed with all needs met, bed alarm active and RN aware of patient status. Feel she would benefit from skilled HHPT services but she seems a bit hesitant about this- alternative would be skilled OP PT services following DC.     Follow Up Recommendations Home health PT;Other (comment) (if she or family refuse HHPT, would pivot to OP PT)    Equipment Recommendations  None recommended by PT (adequately equipped)    Recommendations for Other Services       Precautions / Restrictions Precautions Precautions: Fall;Other (comment) Precaution Comments: SOB/watch sats Restrictions Weight Bearing Restrictions: No      Mobility  Bed Mobility Overal bed mobility: Needs Assistance Bed Mobility: Supine to Sit;Sit to Supine     Supine to sit: Supervision Sit to supine: Supervision   General bed mobility comments: S for safety, no physical assist given  Transfers Overall transfer level: Needs assistance Equipment used: Rolling walker (2 wheeled) Transfers: Sit to/from Stand Sit  to Stand: Supervision         General transfer comment: S for safety and line management, although unsafe and not following cues for hand placement with transfers  Ambulation/Gait Ambulation/Gait assistance: Min guard Gait Distance (Feet): 20 Feet Assistive device: Rolling walker (2 wheeled) Gait Pattern/deviations: Step-through pattern;Decreased step length - right;Decreased step length - left;Trunk flexed Gait velocity: decreased   General Gait Details: slow and steady with RW, tends to push it too far in front of her- question if this is habitual from using rollator at home? No significant balance impairment or ataxia noted with gait  Stairs            Wheelchair Mobility    Modified Rankin (Stroke Patients Only)       Balance Overall balance assessment: Mild deficits observed, not formally tested;History of Falls                                           Pertinent Vitals/Pain Pain Assessment: No/denies pain    Home Living Family/patient expects to be discharged to:: Private residence Living Arrangements: Spouse/significant other Available Help at Discharge: Family;Available 24 hours/day Type of Home: House Home Access: Ramped entrance     Home Layout: One level Home Equipment: Walker - 4 wheels;Wheelchair - power Additional Comments: uses walker in home, uses scooter out of home    Prior Function Level of Independence: Independent with assistive device(s)         Comments: wears 3PM at home  Hand Dominance        Extremity/Trunk Assessment   Upper Extremity Assessment Upper Extremity Assessment: Defer to OT evaluation    Lower Extremity Assessment Lower Extremity Assessment: Generalized weakness    Cervical / Trunk Assessment Cervical / Trunk Assessment: Kyphotic  Communication   Communication: No difficulties  Cognition Arousal/Alertness: Awake/alert Behavior During Therapy: WFL for tasks  assessed/performed Overall Cognitive Status: Within Functional Limits for tasks assessed                                 General Comments: very pleasant and cooperative      General Comments General comments (skin integrity, edema, etc.): SOB with activity, did not have working pulse ox available so unable to formally check sats; SOB recovered with seated rest    Exercises     Assessment/Plan    PT Assessment Patient needs continued PT services  PT Problem List Decreased strength;Obesity;Decreased activity tolerance;Decreased safety awareness;Decreased balance;Decreased mobility;Cardiopulmonary status limiting activity;Decreased coordination       PT Treatment Interventions DME instruction;Balance training;Gait training;Neuromuscular re-education;Functional mobility training;Patient/family education;Therapeutic activities;Therapeutic exercise    PT Goals (Current goals can be found in the Care Plan section)  Acute Rehab PT Goals Patient Stated Goal: go home PT Goal Formulation: With patient Time For Goal Achievement: 04/09/20 Potential to Achieve Goals: Good    Frequency Min 3X/week   Barriers to discharge        Co-evaluation               AM-PAC PT "6 Clicks" Mobility  Outcome Measure Help needed turning from your back to your side while in a flat bed without using bedrails?: None Help needed moving from lying on your back to sitting on the side of a flat bed without using bedrails?: None Help needed moving to and from a bed to a chair (including a wheelchair)?: A Little Help needed standing up from a chair using your arms (e.g., wheelchair or bedside chair)?: A Little Help needed to walk in hospital room?: A Little Help needed climbing 3-5 steps with a railing? : A Lot 6 Click Score: 19    End of Session Equipment Utilized During Treatment: Gait belt;Oxygen Activity Tolerance: Patient tolerated treatment well;Patient limited by fatigue Patient  left: in bed;with call bell/phone within reach;with bed alarm set Nurse Communication: Mobility status;Other (comment) (pt request for purewick) PT Visit Diagnosis: Unsteadiness on feet (R26.81);Muscle weakness (generalized) (M62.81);Difficulty in walking, not elsewhere classified (R26.2)    Time: 6759-1638 PT Time Calculation (min) (ACUTE ONLY): 25 min   Charges:   PT Evaluation $PT Eval Low Complexity: 1 Low PT Treatments $Gait Training: 8-22 mins        Windell Norfolk, DPT, PN1   Supplemental Physical Therapist [place]    Pager [phone] Acute Rehab Office [phone]

## 2020-03-26 NOTE — ED Provider Notes (Signed)
[facility] Provider Note   CSN: [id number] Arrival date & time: 03/26/20  0845  An emergency department physician performed an initial assessment on this suspected stroke patient at 0848.  History Chief Complaint  Patient presents with  . Code Stroke    Jennifer CleverlyBonnie Lowe is a 79 y.o. female.   Cerebrovascular Accident This is a new problem. The current episode started 3 to 5 hours ago. The problem occurs constantly. Pertinent negatives include no chest pain, no abdominal pain, no headaches and no shortness of breath. Nothing aggravates the symptoms. Nothing relieves the symptoms. She has tried nothing for the symptoms. The treatment provided mild relief.       No past medical history on file.  There are no problems to display for this patient.    The histories are not reviewed yet. Please review them in the "History" navigator section and refresh this SmartLink.   OB History   No obstetric history on file.     No family history on file.  Social History   Tobacco Use  . Smoking status: Not on file  Substance Use Topics  . Alcohol use: Not on file  . Drug use: Not on file    Home Medications Prior to Admission medications   Not on File    Allergies    Patient has no allergy information on record.  Review of Systems   Review of Systems  Constitutional: Negative for chills and fever.  HENT: Negative for congestion and rhinorrhea.   Respiratory: Negative for cough and shortness of breath.   Cardiovascular: Negative for chest pain and palpitations.  Gastrointestinal: Negative for abdominal pain, diarrhea, nausea and vomiting.  Genitourinary: Negative for difficulty urinating and dysuria.  Musculoskeletal: Negative for arthralgias and back pain.  Skin: Negative for rash and wound.  Neurological: Positive for weakness. Negative for light-headedness and headaches.    Physical Exam Updated Vital Signs BP 126/86   Pulse  (!) 103   Temp 98.2 F (36.8 C) (Oral)   Resp (!) 23   Ht 4\' 11"  (1.499 m)   Wt 102.3 kg   SpO2 100%   BMI 45.55 kg/m   Physical Exam Vitals and nursing note reviewed. Exam conducted with a chaperone present.  Constitutional:      General: She is not in acute distress.    Appearance: Normal appearance.  HENT:     Head: Normocephalic and atraumatic.     Nose: No rhinorrhea.  Eyes:     General:        Right eye: No discharge.        Left eye: No discharge.     Conjunctiva/sclera: Conjunctivae normal.  Cardiovascular:     Rate and Rhythm: Normal rate and regular rhythm.  Pulmonary:     Effort: Pulmonary effort is normal. No respiratory distress.     Breath sounds: No stridor.  Abdominal:     General: Abdomen is flat. There is no distension.     Palpations: Abdomen is soft.  Musculoskeletal:        General: No tenderness or signs of injury.  Skin:    General: Skin is warm and dry.  Neurological:     General: No focal deficit present.     Mental Status: She is alert. Mental status is at baseline.     Sensory: No sensory deficit.     Comments: 5 out of 5 strength in the left upper and lower extremities, 4-5 grip arm flexion  extension in the upper extremities, 3-5 hip flexion in the lower extremity on the right left 5 out of 5, speech is normal, no aphasia, cranial nerves equal symmetric throughout  Psychiatric:        Mood and Affect: Mood normal.        Behavior: Behavior normal.     ED Results / Procedures / Treatments   Labs (all labs ordered are listed, but only abnormal results are displayed) Labs Reviewed  PROTIME-INR - Abnormal; Notable for the following components:      Result Value   Prothrombin Time 33.8 (*)    INR 3.5 (*)    All other components within normal limits  APTT - Abnormal; Notable for the following components:   aPTT 42 (*)    All other components within normal limits  COMPREHENSIVE METABOLIC PANEL - Abnormal; Notable for the following  components:   Glucose, Bld 146 (*)    Creatinine, Ser 1.07 (*)    GFR calc non Af Amer 50 (*)    GFR calc Af Amer 58 (*)    All other components within normal limits  CBG MONITORING, ED - Abnormal; Notable for the following components:   Glucose-Capillary 150 (*)    All other components within normal limits  I-STAT CHEM 8, ED - Abnormal; Notable for the following components:   Glucose, Bld 140 (*)    Calcium, Ion 1.13 (*)    All other components within normal limits  SARS CORONAVIRUS 2 BY RT PCR (HOSPITAL ORDER, PERFORMED IN [facility])  CBC  DIFFERENTIAL  URINALYSIS, ROUTINE W REFLEX MICROSCOPIC  CBG MONITORING, ED    EKG EKG Interpretation  Date/Time:  Friday March 26 2020 16:10:96 EDT Ventricular Rate:  97 PR Interval:    QRS Duration: 96 QT Interval:  378 QTC Calculation: 481 R Axis:   -33 Text Interpretation: Atrial fibrillation Ventricular premature complex Left axis deviation Low voltage, precordial leads Consider anterior infarct Confirmed by Cherlynn Perches ([id number]) on 03/26/2020 9:28:24 AM   Radiology CT Code Stroke CTA Head W/WO contrast  Result Date: 03/26/2020 CLINICAL DATA:  Left-sided weakness.  Stroke suspected. EXAM: CT HEAD WITHOUT CONTRAST CT ANGIOGRAPHY OF THE HEAD AND NECK TECHNIQUE: Contiguous axial images were obtained from the base of the skull through the vertex without intravenous contrast. Multidetector CT imaging of the head and neck was performed using the standard protocol during bolus administration of intravenous contrast. Multiplanar CT image reconstructions and MIPs were obtained to evaluate the vascular anatomy. Carotid stenosis measurements (when applicable) are obtained utilizing NASCET criteria, using the distal internal carotid diameter as the denominator. CONTRAST:  75mL OMNIPAQUE IOHEXOL 350 MG/ML SOLN COMPARISON:  CT head 01/23/2020 FINDINGS: CTA NECK Aortic arch: Calcific atherosclerosis. Right carotid system: No evidence  significant (greater than 50%)w stenosis or occlusion Left carotid system: Predominant calcific atherosclerosis at the bifurcation without greater than 50% narrowing. Vertebral arteries:Left dominant. The right vertebral artery is diminutive throughout its course and likely terminates as PICA. No significant stenosis of the left vertebral artery. Mild narrowing of the left vertebral artery secondary to mass effect from degenerative osteophytes. Skeleton: Multilevel degenerative changes of the cervical spine with severe degenerative disc changes at C5-C6 and C6-C7 and severe facet arthropathy at C4-C5 with degenerative anterolisthesis of C4 on C5. Degenerative pannus at the craniocervical junction. Other neck: The right thyroid gland is atrophic or absent. CTA HEAD Anterior circulation: No evidence of significant (greater than 50%) stenosis or occlusion. No aneurysm. Posterior circulation:  No evidence of significant (greater than 50%) stenosis or occlusion. No aneurysm. Venous sinuses: No evidence of dural sinus thrombosis, although evaluation is limited given arterial timing. Anatomic variants: The right ACA appears to bifurcate and there is no appreciable anterior communicating artery IMPRESSION: 1. No evidence of significant (greater than 50%) stenosis or occlusion in the head or neck. 2. Approximately 7 mm pulmonary nodule within the visualized right upper lobe. This may be infectious/inflammatory given mild surrounding opacities; however, non-contrast chest CT at 6-12 months is recommended to ensure resolution or stability. If the nodule is stable at time of repeat CT, then future CT at 18-24 months (from today's scan) is considered optional for low-risk patients, but is recommended for high-risk patients. This recommendation follows the consensus statement: Guidelines for Management of Incidental Pulmonary Nodules Detected on CT Images: From the Fleischner Society 2017; Radiology 2017; 284:228-243. Code stroke  imaging results were communicated on 03/26/2020 at 9:11 am to provider Dr. Ezzie Dural via telephone, who verbally acknowledged these results. Pulmonary nodule finding was communicated to Dr. Ezzie Dural at 9:30 am. Electronically Signed   By: Feliberto Harts MD   On: 03/26/2020 09:35   CT Code Stroke CTA Neck W/WO contrast  Result Date: 03/26/2020 CLINICAL DATA:  Left-sided weakness.  Stroke suspected. EXAM: CT HEAD WITHOUT CONTRAST CT ANGIOGRAPHY OF THE HEAD AND NECK TECHNIQUE: Contiguous axial images were obtained from the base of the skull through the vertex without intravenous contrast. Multidetector CT imaging of the head and neck was performed using the standard protocol during bolus administration of intravenous contrast. Multiplanar CT image reconstructions and MIPs were obtained to evaluate the vascular anatomy. Carotid stenosis measurements (when applicable) are obtained utilizing NASCET criteria, using the distal internal carotid diameter as the denominator. CONTRAST:  23mL OMNIPAQUE IOHEXOL 350 MG/ML SOLN COMPARISON:  CT head 01/23/2020 FINDINGS: CTA NECK Aortic arch: Calcific atherosclerosis. Right carotid system: No evidence significant (greater than 50%)w stenosis or occlusion Left carotid system: Predominant calcific atherosclerosis at the bifurcation without greater than 50% narrowing. Vertebral arteries:Left dominant. The right vertebral artery is diminutive throughout its course and likely terminates as PICA. No significant stenosis of the left vertebral artery. Mild narrowing of the left vertebral artery secondary to mass effect from degenerative osteophytes. Skeleton: Multilevel degenerative changes of the cervical spine with severe degenerative disc changes at C5-C6 and C6-C7 and severe facet arthropathy at C4-C5 with degenerative anterolisthesis of C4 on C5. Degenerative pannus at the craniocervical junction. Other neck: The right thyroid gland is atrophic or absent. CTA HEAD Anterior circulation: No  evidence of significant (greater than 50%) stenosis or occlusion. No aneurysm. Posterior circulation: No evidence of significant (greater than 50%) stenosis or occlusion. No aneurysm. Venous sinuses: No evidence of dural sinus thrombosis, although evaluation is limited given arterial timing. Anatomic variants: The right ACA appears to bifurcate and there is no appreciable anterior communicating artery IMPRESSION: 1. No evidence of significant (greater than 50%) stenosis or occlusion in the head or neck. 2. Approximately 7 mm pulmonary nodule within the visualized right upper lobe. This may be infectious/inflammatory given mild surrounding opacities; however, non-contrast chest CT at 6-12 months is recommended to ensure resolution or stability. If the nodule is stable at time of repeat CT, then future CT at 18-24 months (from today's scan) is considered optional for low-risk patients, but is recommended for high-risk patients. This recommendation follows the consensus statement: Guidelines for Management of Incidental Pulmonary Nodules Detected on CT Images: From the Fleischner Society 2017; Radiology 2017;  626:948-546. Code stroke imaging results were communicated on 03/26/2020 at 9:11 am to provider Dr. Ezzie Dural via telephone, who verbally acknowledged these results. Pulmonary nodule finding was communicated to Dr. Ezzie Dural at 9:30 am. Electronically Signed   By: Feliberto Harts MD   On: 03/26/2020 09:35   CT HEAD CODE STROKE WO CONTRAST  Result Date: 03/26/2020 CLINICAL DATA:  Code stroke.  Right-sided arm/leg weakness. EXAM: CT HEAD WITHOUT CONTRAST TECHNIQUE: Contiguous axial images were obtained from the base of the skull through the vertex without intravenous contrast. COMPARISON:  CT head 01/23/2020 FINDINGS: Brain: No evidence of acute large vascular territory infarct. No acute hemorrhage. Moderate subcortical and periventricular hypodensity, nonspecific but likely the sequela of chronic microvascular ischemic  disease. Similar cerebral volume loss with ex vacuo ventricular dilation. No hydrocephalus. 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 Otherwise, no mass lesion or abnormal mass effect. Vascular: Calcific atherosclerosis.  No hyperdense vessel. Skull: Normal. Negative for fracture or focal lesion. Sinuses/Orbits: No acute finding. Other: None. ASPECTS ([place] Stroke Program Early CT Score): 10 IMPRESSION: 1. No acute intracranial abnormality. Specifically, no acute hemorrhage or evidence of acute large vascular territory infarct. 2. 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 3. ASPECTS is 10 Code stroke imaging results were communicated on 03/26/2020 at 9:11 am to provider Dr. Ezzie Dural via telephone, who verbally acknowledged these results. Electronically Signed   By: Feliberto Harts MD   On: 03/26/2020 09:15    Procedures .Critical Care Performed by: Sabino Donovan, MD Authorized by: Sabino Donovan, MD   Critical care provider statement:    Critical care time (minutes):  45   Critical care was time spent personally by me on the following activities:  Discussions with consultants, evaluation of patient's response to treatment, examination of patient, ordering and performing treatments and interventions, ordering and review of laboratory studies, ordering and review of radiographic studies, pulse oximetry, re-evaluation of patient's condition, obtaining history from patient or surrogate, review of old charts, blood draw for specimens and development of treatment plan with patient or surrogate   (including critical care time)  Medications Ordered in ED Medications  sodium chloride flush (NS) 0.9 % injection 3 mL (has no administration in time range)  iohexol (OMNIPAQUE) 350 MG/ML injection 75 mL (75 mLs Intravenous Contrast Given 03/26/20 0910)    ED Course  I have  reviewed the triage vital signs and the nursing notes.  Pertinent labs & imaging results that were available during my care of the patient were reviewed by me and considered in my medical decision making (see chart for details).    MDM Rules/Calculators/A&P                          Code stroke called last known normal 4 hours prior to arrival, symptoms as described above, noncontrasted CT scan without acute hemorrhage or finding.  Patient blood pressure stable glucose is stable, symptoms included right-sided weakness possible speech difficulties.  Speech appears improved here.  Neurology at bedside,  CTA of the neck is without any acute greater than 50% lesion.  Patient's INR is 3.5, and the window for treatment of 3 hours has been exceeded.  For multiple reasons we are not giving TPA to this patient but she will need a medical admission to make sure she is fully optimized and to make sure she gets further work-up to  include echocardiogram, possible PT OT in the setting of new right lower extremity weakness that still persist, upper extremity seem much improved.  Neurology agrees no further work-up from their standpoint other than admission for medical optimization and PT OT evaluation. Covid test pending, UA pending.  The patient will be admitted to the hospitalist.  For the remainder this patient's care please see inpatient team notes.  I will intervene as needed while the patient remains in the emergency department.  CRITICAL CARE Performed by: Sabino Donovan   Total critical care time: 40 minutes  Critical care time was exclusive of separately billable procedures and treating other patients.  Critical care was necessary to treat or prevent imminent or life-threatening deterioration.  Critical care was time spent personally by me on the following activities: development of treatment plan with patient and/or surrogate as well as nursing, discussions with consultants, evaluation of patient's  response to treatment, examination of patient, obtaining history from patient or surrogate, ordering and performing treatments and interventions, ordering and review of laboratory studies, ordering and review of radiographic studies, pulse oximetry and re-evaluation of patient's condition.   Final Clinical Impression(s) / ED Diagnoses Final diagnoses:  Right sided weakness    Rx / DC Orders ED Discharge Orders    None       Sabino Donovan, MD 03/26/20 [phone]

## 2020-03-27 DIAGNOSIS — J9610 Chronic respiratory failure, unspecified whether with hypoxia or hypercapnia: Secondary | ICD-10-CM | POA: Diagnosis present

## 2020-03-27 DIAGNOSIS — I482 Chronic atrial fibrillation, unspecified: Secondary | ICD-10-CM | POA: Diagnosis not present

## 2020-03-27 DIAGNOSIS — Z7982 Long term (current) use of aspirin: Secondary | ICD-10-CM | POA: Diagnosis not present

## 2020-03-27 DIAGNOSIS — I10 Essential (primary) hypertension: Secondary | ICD-10-CM | POA: Diagnosis present

## 2020-03-27 DIAGNOSIS — K219 Gastro-esophageal reflux disease without esophagitis: Secondary | ICD-10-CM | POA: Diagnosis present

## 2020-03-27 DIAGNOSIS — Z8249 Family history of ischemic heart disease and other diseases of the circulatory system: Secondary | ICD-10-CM | POA: Diagnosis not present

## 2020-03-27 DIAGNOSIS — G9349 Other encephalopathy: Secondary | ICD-10-CM | POA: Diagnosis present

## 2020-03-27 DIAGNOSIS — Z952 Presence of prosthetic heart valve: Secondary | ICD-10-CM | POA: Diagnosis not present

## 2020-03-27 DIAGNOSIS — N390 Urinary tract infection, site not specified: Secondary | ICD-10-CM | POA: Diagnosis present

## 2020-03-27 DIAGNOSIS — E118 Type 2 diabetes mellitus with unspecified complications: Secondary | ICD-10-CM | POA: Diagnosis present

## 2020-03-27 DIAGNOSIS — Z888 Allergy status to other drugs, medicaments and biological substances status: Secondary | ICD-10-CM | POA: Diagnosis not present

## 2020-03-27 DIAGNOSIS — Z79899 Other long term (current) drug therapy: Secondary | ICD-10-CM | POA: Diagnosis not present

## 2020-03-27 DIAGNOSIS — N3000 Acute cystitis without hematuria: Secondary | ICD-10-CM | POA: Diagnosis not present

## 2020-03-27 DIAGNOSIS — Z9981 Dependence on supplemental oxygen: Secondary | ICD-10-CM | POA: Diagnosis not present

## 2020-03-27 DIAGNOSIS — Z880 Allergy status to penicillin: Secondary | ICD-10-CM | POA: Diagnosis not present

## 2020-03-27 DIAGNOSIS — Z20822 Contact with and (suspected) exposure to covid-19: Secondary | ICD-10-CM | POA: Diagnosis present

## 2020-03-27 DIAGNOSIS — G459 Transient cerebral ischemic attack, unspecified: Secondary | ICD-10-CM | POA: Diagnosis present

## 2020-03-27 DIAGNOSIS — Z885 Allergy status to narcotic agent status: Secondary | ICD-10-CM | POA: Diagnosis not present

## 2020-03-27 DIAGNOSIS — Z7901 Long term (current) use of anticoagulants: Secondary | ICD-10-CM | POA: Diagnosis not present

## 2020-03-27 DIAGNOSIS — R791 Abnormal coagulation profile: Secondary | ICD-10-CM | POA: Diagnosis not present

## 2020-03-27 DIAGNOSIS — I639 Cerebral infarction, unspecified: Secondary | ICD-10-CM | POA: Diagnosis present

## 2020-03-27 DIAGNOSIS — B9620 Unspecified Escherichia coli [E. coli] as the cause of diseases classified elsewhere: Secondary | ICD-10-CM | POA: Diagnosis present

## 2020-03-27 DIAGNOSIS — Z6841 Body Mass Index (BMI) 40.0 and over, adult: Secondary | ICD-10-CM | POA: Diagnosis not present

## 2020-03-27 DIAGNOSIS — R911 Solitary pulmonary nodule: Secondary | ICD-10-CM | POA: Diagnosis present

## 2020-03-27 DIAGNOSIS — E785 Hyperlipidemia, unspecified: Secondary | ICD-10-CM | POA: Diagnosis present

## 2020-03-27 DIAGNOSIS — Z794 Long term (current) use of insulin: Secondary | ICD-10-CM | POA: Diagnosis not present

## 2020-03-27 DIAGNOSIS — I480 Paroxysmal atrial fibrillation: Secondary | ICD-10-CM | POA: Diagnosis present

## 2020-03-27 LAB — PROTIME-INR
INR: 2.5 — ABNORMAL HIGH (ref 0.8–1.2)
Prothrombin Time: 26.5 seconds — ABNORMAL HIGH (ref 11.4–15.2)

## 2020-03-27 LAB — GLUCOSE, CAPILLARY
Glucose-Capillary: 164 mg/dL — ABNORMAL HIGH (ref 70–99)
Glucose-Capillary: 158 mg/dL — ABNORMAL HIGH (ref 70–99)
Glucose-Capillary: 208 mg/dL — ABNORMAL HIGH (ref 70–99)
Glucose-Capillary: 185 mg/dL — ABNORMAL HIGH (ref 70–99)

## 2020-03-27 LAB — LIPID PANEL
Cholesterol: 135 mg/dL (ref 0–200)
HDL: 31 mg/dL — ABNORMAL LOW (ref >40)
LDL Cholesterol: 66 mg/dL (ref 0–99)
Total CHOL/HDL Ratio: 4.4 RATIO
Triglycerides: 191 mg/dL — ABNORMAL HIGH (ref <150)
VLDL: 38 mg/dL (ref 0–40)

## 2020-03-27 MED ORDER — WITCH HAZEL-GLYCERIN EX PADS
MEDICATED_PAD | CUTANEOUS | Status: DC | PRN
  Filled 2020-03-27: qty 100

## 2020-03-27 MED ORDER — WARFARIN SODIUM 2.5 MG PO TABS
2.5000 mg | ORAL_TABLET | Freq: Once | ORAL | Status: AC
  Administered 2020-03-27: 2.5 mg via ORAL
  Filled 2020-03-27: qty 1

## 2020-03-27 MED ORDER — SALINE SPRAY 0.65 % NA SOLN
1.0000 | NASAL | Status: DC | PRN
  Filled 2020-03-27: qty 44

## 2020-03-27 NOTE — Progress Notes (Signed)
STROKE TEAM PROGRESS NOTE   INTERVAL HISTORY No family is at the bedside.  Patient lying in bed, mildly lethargic, stated compliance with Coumadin.  INR 3.5 on admission, this morning 2.5.  Patient not able to have MRI, will repeat CT.  Okay to continue Coumadin.   OBJECTIVE Vitals:   03/26/20 2330 03/27/20 0318 03/27/20 0814 03/27/20 1205  BP: 122/65 114/74 127/82 (!) 135/97  Pulse: 80 89 96 94  Resp: 18 19 20 18   Temp: 98.6 F (37 C) 97.7 F (36.5 C) 98.1 F (36.7 C) 97.9 F (36.6 C)  TempSrc: Oral  Oral Oral  SpO2: 95%  99% 100%  Weight:      Height:        CBC:  Recent Labs  Lab 03/26/20 0858 03/26/20 0903  WBC 9.9  --   NEUTROABS 7.2  --   HGB 12.9 13.3  HCT 41.5 39.0  MCV 92.6  --   PLT 173  --     Basic Metabolic Panel:  Recent Labs  Lab 03/26/20 0858 03/26/20 0903  NA 141 140  K 3.8 3.9  CL 100 98  CO2 29  --   GLUCOSE 146* 140*  BUN 13 17  CREATININE 1.07* 0.90  CALCIUM 9.2  --     Lipid Panel:     Component Value Date/Time   CHOL 135 03/27/2020 0924   TRIG 191 (H) 03/27/2020 0924   HDL 31 (L) 03/27/2020 0924   CHOLHDL 4.4 03/27/2020 0924   VLDL 38 03/27/2020 0924   LDLCALC 66 03/27/2020 0924   HgbA1c:  Lab Results  Component Value Date   HGBA1C 6.9 (H) 03/26/2020   Urine Drug Screen: No results found for: LABOPIA, COCAINSCRNUR, LABBENZ, AMPHETMU, THCU, LABBARB  Alcohol Level No results found for: [facility]  IMAGING  CT Code Stroke CTA Head W/WO contrast CT Code Stroke CTA Neck W/WO contrast 03/26/2020 IMPRESSION:  1. No evidence of significant (greater than 50%) stenosis or occlusion in the head or neck.  2. Approximately 7 mm pulmonary nodule within the visualized right upper lobe. This may be infectious/inflammatory given mild surrounding opacities; however, non-contrast chest CT at 6-12 months is recommended to ensure resolution or stability. If the nodule is stable at time of repeat CT, then future CT at 18-24 months (from today's  scan) is considered optional for low-risk patients, but is recommended for high-risk patients. This recommendation follows the consensus statement: Guidelines for Management of Incidental Pulmonary Nodules Detected on CT Images: From the Fleischner Society 2017; Radiology 2017; 284:228-243.   DG CHEST PORT 1 VIEW 03/26/2020 IMPRESSION:  1. Cardiac enlargement.  2. No acute findings.   ECHOCARDIOGRAM COMPLETE  03/26/2020 IMPRESSIONS   1. Left ventricular ejection fraction, by estimation, is 60 to 65%. The left ventricle has normal function. The left ventricle has no regional wall motion abnormalities. There is mild concentric left ventricular hypertrophy. Left ventricular diastolic function could not be evaluated.   2. Right ventricular systolic function is low normal. The right ventricular size is normal. There is mildly elevated pulmonary artery systolic pressure.   3. Left atrial size was mildly dilated.   4. The mitral valve is normal in structure. Mild mitral valve regurgitation.   5. Tricuspid valve regurgitation is mild to moderate.   6. Mild perivalvular leak in the area of the right coronary ostium is only seen in the short axis view.. The aortic valve has been repaired/replaced. Aortic valve regurgitation is not visualized. There is a valve present  in the aortic position. Echo findings are consistent with normal structure and function of the aortic valve prosthesis.   7. The inferior vena cava is dilated in size with >50% respiratory variability, suggesting right atrial pressure of 8 mmHg.   CT HEAD CODE STROKE WO CONTRAST 03/26/2020 IMPRESSION:  1. No acute intracranial abnormality. Specifically, no acute hemorrhage or evidence of acute large vascular territory infarct.  2. 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  3. ASPECTS is 10   CT Head WO Contrast - pending  ECG - atrial fibrillation - ventricular response 97 BPM (See  cardiology reading for complete details)   PHYSICAL EXAM  Temp:  [97.7 F (36.5 C)-98.6 F (37 C)] 98.6 F (37 C) (09/04 2001) Pulse Rate:  [77-96] 77 (09/04 2001) Resp:  [18-20] 18 (09/04 2001) BP: (114-145)/(58-97) 119/73 (09/04 2001) SpO2:  [93 %-100 %] 100 % (09/04 2001)  General - Well nourished, well developed, in no apparent distress, mildly lethargic.  Ophthalmologic - fundi not visualized due to noncooperation.  Cardiovascular - irregularly irregular heart rate and rhythm.  Neuro - awake alert, orientated x3.  No aphasia, follows simple commands, able to name and repeat.  Visual fields are full, EOMI, PERRL. Facial symmetrical, tongue midline.  Left upper and lower extremity 4+/5, right upper and lower extremity 4/5 with giveaway weakness, mild drift but not pronator drift.  Sensation symmetrical, finger-to-nose grossly intact.  Gait not tested.   ASSESSMENT/PLAN [name] is a 79 y.o. 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 She did not receive IV t-PA due to therapeutic warfarin level.  Most likely encephalopathy due to UTI, although small infarct cannot be completely ruled out since not able to have MRI  CT Head - No acute intracranial abnormality.   MRI head - not compatible with MRI  CT repeat in a.m.  CTA H&N - No evidence of significant (greater than 50%) stenosis or occlusion in the head or neck.   2D Echo - EF 60 - 65%. No cardiac source of emboli identified.   Sars Corona Virus 2 - negative  LDL - 66  HgbA1c - 6.9  INR 3.5-> 2.5  VTE prophylaxis - warfarin  aspirin 81 mg daily and warfarin daily prior to admission, now on aspirin 81 mg daily and warfarin daily.  Continue on discharge  Patient counseled to be compliant with her antithrombotic  medications  Ongoing aggressive stroke risk factor management  Therapy recommendations:  HH PT  Disposition:  Pending  UTI  UA WBC > 50  On Rocephin  Urine culture pending  A. Fib  On home Coumadin  INR 3.5-> 2.5  Continue Coumadin  INR goal 2.0-3.0  Hypertension  Home BP meds: diltiazem ; metoprolol  Current BP meds: diltiazem ; metoprolol  Stable . Long-term BP goal normotensive  Hyperlipidemia  Home Lipid lowering medication: Lipitor 80 mg daily   LDL 66, goal < 70  Current lipid lowering medication: Lipitor 80 mg daily   Continue statin at discharge  Diabetes, controlled  Home diabetic meds: insulin  Current diabetic meds: insulin  HgbA1c 6.9, goal < 7.0  SSI  CBG monitoring  Close PCP follow-up  Other Stroke Risk Factors  Advanced age  Obesity, Body mass index is 45.55 kg/m., recommend weight  loss, diet and exercise as appropriate    S/p TAVR  Other Active Problems  Code status - Full code  7 mm pulmonary nodule within the visualized right upper lobe. This may be infectious/inflammatory given mild surrounding opacities; however, non-contrast chest CT at 6-12 months is recommended  CKD - stage 3a - creatinine - 1.07->0.90  Hospital day # 0  Marvel Plan, MD PhD Stroke Neurology 03/27/2020 8:53 PM    To contact Stroke Continuity provider, please refer to [email or web address]. After hours, contact General Neurology

## 2020-03-27 NOTE — Progress Notes (Signed)
Per patient Dr. Loraine Leriche. [place]& Dr. Arleta Creek told pt never to have an MRI or it would rip her apart. MRI on phone to listen to patient with this nurse present. Patient presented to this nurse a metal chain that has patient name on it and states:  "NO MRI High BP &  Cholesterol ON COUMADIN DIABETIC" MRI cancelled.

## 2020-03-27 NOTE — Evaluation (Signed)
Speech Language Pathology Evaluation Patient Details Name: Jennifer Lowe MRN: [id number] DOB: 20-Oct-1940 Today's Date: 03/27/2020 Time: 3244-0102 SLP Time Calculation (min) (ACUTE ONLY): 25 min  Problem List:  Patient Active Problem List   Diagnosis Date Noted  . Acute CVA (cerebrovascular accident) (HCC) 03/27/2020  . TIA (transient ischemic attack) 03/26/2020  . Supratherapeutic INR 03/26/2020  . Pulmonary nodule 03/26/2020  . Atrial fibrillation, chronic (HCC) 03/26/2020  . Obesity, Class III, BMI 40-49.9 (morbid obesity) (HCC) 03/26/2020  . Urinary tract infection 03/26/2020   Past Medical History:  Past Medical History:  Diagnosis Date  . Atrial fibrillation (HCC)   . Chronic respiratory failure (HCC)   . Diabetes mellitus type 2 with complications (HCC)   . Hyperlipidemia   . Hypertension   . Morbid obesity (HCC)    Past Surgical History:  Past Surgical History:  Procedure Laterality Date  . ANOMALOUS PULMONARY VENOUS RETURN REPAIR, TOTAL    . BACK SURGERY     L4-5  . VAGINAL PROLAPSE REPAIR     HPI:  79 yo female presenting with R LE weakness and dysarthric speech. CTH negative, MRI pending. No tpa given. Admited for further work-up. PMH obesity, A-fib, TAVR, HTN, DM, HLD, chronic respiratory failure on 3L, GERD.    Assessment / Plan / Recommendation Clinical Impression  Pt presents with likely baseline cognitive-linguistic function.  She scored a 25/30 per the [employer], considered WNL with <HS education. (Pt completed the seventh grade).  Demonstrated adequate short term recall, orientation, judgment, and basic verbal problem-solving.  Speech was clear and fluent.  There were occasional distortions of consonants, but she was fully intelligible.  Expressive and receptive language were WNL.  No SLP f/u is recommended.  D/W pt.  Our service will sign off.     SLP Assessment  SLP Recommendation/Assessment: Patient does not need any further Speech Lanaguage Pathology  Services SLP Visit Diagnosis: Cognitive communication deficit (R41.841)    Follow Up Recommendations  None    Frequency and Duration           SLP Evaluation Cognition  Overall Cognitive Status: Within Functional Limits for tasks assessed Arousal/Alertness: Awake/alert Orientation Level: Oriented X4 Attention: Selective Selective Attention: Impaired Selective Attention Impairment: Verbal basic Memory: Appears intact Awareness: Appears intact Problem Solving: Appears intact Safety/Judgment: Appears intact       Comprehension  Auditory Comprehension Overall Auditory Comprehension: Appears within functional limits for tasks assessed    Expression Expression Primary Mode of Expression: Verbal Verbal Expression Overall Verbal Expression: Appears within functional limits for tasks assessed Initiation: No impairment Level of Generative/Spontaneous Verbalization: Conversation Repetition: No impairment Written Expression Dominant Hand: Right Written Expression: Within Functional Limits   Oral / Motor  Oral Motor/Sensory Function Overall Oral Motor/Sensory Function: Within functional limits Motor Speech Overall Motor Speech: Appears within functional limits for tasks assessed   GO                    Jennifer Lowe 03/27/2020, 1:45 PM  Jennifer Wander L. Samson Frederic, MA CCC/SLP [facility] Office number [phone] Pager [phone]

## 2020-03-27 NOTE — Progress Notes (Signed)
PROGRESS NOTE    Jennifer Lowe  AGT:[id number] DOB: 12/04/1940 DOA: 03/26/2020 PCP: Patient, No Pcp Per  Outpatient Specialists:   Brief Narrative:  Patient is a 79 year old female past medical history significant for atrial fibrillation on Coumadin, chronic respiratory failure on 3 L, s/p TAVR, hypertension, hyperlipidemia, diabetes mellitus type 2, GERD, and morbid obesity.  Patient was admitted with right-sided weakness, and concerns for possible TIA.  Apparently, the right-sided weakness is resolved.  UA suggestive of UTI.  We will proceed with urine culture.  Start patient on IV Rocephin.  Neurology team is directing possible TIA.  Initial CT head is nonrevealing.  Patient cannot proceed with MRI of the brain due to metallic rods.  Neurology plans to repeat CT head in the morning.  Assessment & Plan:   Principal Problem:   TIA (transient ischemic attack) Active Problems:   Supratherapeutic INR   Pulmonary nodule   Atrial fibrillation, chronic (HCC)   Obesity, Class III, BMI 40-49.9 (morbid obesity) (HCC)   Urinary tract infection   Acute CVA (cerebrovascular accident) (HCC)  TIA versus acute CVA:  Acute.  Patient presented with complaints of right-sided weakness and slurred speech.  Initial evaluation with CT scan of the brain and CT angiogram of the head and neck did not show any acute signs of a stroke or large vessel occlusion.  Not a TPA candidate due to being on anticoagulation. -Admit to telemetry bed -Stroke order set initiated -Neuro checks -Check echocardiogram -Check Hemoglobin A1c and lipid panel in a.m. -PT/OT/Speech consulted -Continue current regimen of aspirin and statin -Appreciate neurology consultative services, will follow-up  -Social work consulted 03/27/2020: Neurology team is directing care.  Symptoms have resolved.  UA suggestive of likely UTI.  For repeat CT head in the morning.  Urinary tract infection:  Urinalysis was positive moderate leukocytes,  positive nitrates, few bacteria, and greater than 50 WBCs. -Check urine culture -Empiric antibiotics of Rocephin IV And 11/11/2019: Follow urine culture result.  Chronic atrial fibrillation with supratherapeutic INR: INR 3.5 on admission.  Currently rate controlled. CHA2DS2-VASc score = 6 -Resume Cardizem and metoprolol  -Coumadin per pharmacy  Insulin-dependent diabetes mellitus:  Home medications include Novolin 30 units every morning and 23 units nightly. -Hypoglycemic protocol -Follow-up hemoglobin A1c -CBGs before every meal and at bedtime with moderate SSI  -Adjust regimen as needed 03/27/2020: Continue to monitor closely and manage expectantly.    History of TAVR  Pulmonary nodule:  Incidentally found on CT angiogram of the head and neck noted to be 7 mm.   Hyperlipidemia:  -Continue atorvastatin   Morbid obesity:  BMI 45.55 kg/m -Diet and exercise.    DVT prophylaxis: Coumadin Code Status: Full code Family Communication:  Disposition Plan: This will depend on hospital course   Consultants:   Neurology   Procedures:   None  Antimicrobials:   IV Rocephin   Subjective: No new complaints. Right-sided weakness is resolved.  Objective: Vitals:   03/27/20 0318 03/27/20 0814 03/27/20 1205 03/27/20 1737  BP: 114/74 127/82 (!) 135/97 (!) 116/58  Pulse: 89 96 94 78  Resp: 19 20 18 18   Temp: 97.7 F (36.5 C) 98.1 F (36.7 C) 97.9 F (36.6 C) 98.5 F (36.9 C)  TempSrc:  Oral Oral Oral  SpO2:  99% 100% 100%  Weight:      Height:        Intake/Output Summary (Last 24 hours) at 03/27/2020 1920 Last data filed at 03/27/2020 1738 Gross per 24 hour  Intake 260.79  ml  Output 1950 ml  Net -1689.21 ml   Filed Weights   03/26/20 0800 03/26/20 0915  Weight: 102.3 kg 102.3 kg    Examination:  General exam: Appears calm and comfortable.  Patient is morbidly obese. Respiratory system: Clear to auscultation.  Cardiovascular system: S1 & S2, irregularly  irregular.   Gastrointestinal system: Abdomen is morbidly obese, soft and nontender.  Organs are difficult to assess.   Central nervous system: Alert and oriented.  Patient moves all extremities. Extremities: Leg edema  Data Reviewed: I have personally reviewed following labs and imaging studies  CBC: Recent Labs  Lab 03/26/20 0858 03/26/20 0903  WBC 9.9  --   NEUTROABS 7.2  --   HGB 12.9 13.3  HCT 41.5 39.0  MCV 92.6  --   PLT 173  --    Basic Metabolic Panel: Recent Labs  Lab 03/26/20 0858 03/26/20 0903  NA 141 140  K 3.8 3.9  CL 100 98  CO2 29  --   GLUCOSE 146* 140*  BUN 13 17  CREATININE 1.07* 0.90  CALCIUM 9.2  --    GFR: Estimated Creatinine Clearance: 54.3 mL/min (by C-G formula based on SCr of 0.9 mg/dL). Liver Function Tests: Recent Labs  Lab 03/26/20 0858  AST 17  ALT 14  ALKPHOS 61  BILITOT 0.7  PROT 6.7  ALBUMIN 3.5   No results for input(s): LIPASE, AMYLASE in the last 168 hours. No results for input(s): AMMONIA in the last 168 hours. Coagulation Profile: Recent Labs  Lab 03/26/20 0858 03/27/20 0924  INR 3.5* 2.5*   Cardiac Enzymes: No results for input(s): CKTOTAL, CKMB, CKMBINDEX, TROPONINI in the last 168 hours. BNP (last 3 results) No results for input(s): PROBNP in the last 8760 hours. HbA1C: Recent Labs    03/26/20 0904  HGBA1C 6.9*   CBG: Recent Labs  Lab 03/26/20 0849 03/26/20 2136 03/27/20 0610 03/27/20 1340 03/27/20 1642  GLUCAP 150* 143* 164* 158* 208*   Lipid Profile: Recent Labs    03/27/20 0924  CHOL 135  HDL 31*  LDLCALC 66  TRIG 409*  CHOLHDL 4.4   Thyroid Function Tests: No results for input(s): TSH, T4TOTAL, FREET4, T3FREE, THYROIDAB in the last 72 hours. Anemia Panel: No results for input(s): VITAMINB12, FOLATE, FERRITIN, TIBC, IRON, RETICCTPCT in the last 72 hours. Urine analysis:    Component Value Date/Time   COLORURINE YELLOW 03/26/2020 0951   APPEARANCEUR HAZY (A) 03/26/2020 0951    LABSPEC 1.034 (H) 03/26/2020 0951   PHURINE 7.0 03/26/2020 0951   GLUCOSEU NEGATIVE 03/26/2020 0951   HGBUR NEGATIVE 03/26/2020 0951   BILIRUBINUR NEGATIVE 03/26/2020 0951   KETONESUR NEGATIVE 03/26/2020 0951   PROTEINUR NEGATIVE 03/26/2020 0951   NITRITE POSITIVE (A) 03/26/2020 0951   LEUKOCYTESUR MODERATE (A) 03/26/2020 0951   Sepsis Labs: (procalcitonin:4,lacticidven:4)  ) Recent Results (from the past 240 hour(s))  SARS Coronavirus 2 by RT PCR (hospital order, performed in [facility] hospital lab) Nasopharyngeal Urine, Clean Catch     Status: None   Collection Time: 03/26/20  9:51 AM   Specimen: Urine, Clean Catch; Nasopharyngeal  Result Value Ref Range Status   SARS Coronavirus 2 NEGATIVE NEGATIVE Final    Comment: (NOTE) SARS-CoV-2 target nucleic acids are NOT DETECTED.  The SARS-CoV-2 RNA is generally detectable in upper and lower respiratory specimens during the acute phase of infection. The lowest concentration of SARS-CoV-2 viral copies this assay can detect is 250 copies / mL. A negative result does not preclude  SARS-CoV-2 infection and should not be used as the sole basis for treatment or other patient management decisions.  A negative result may occur with improper specimen collection / handling, submission of specimen other than nasopharyngeal swab, presence of viral mutation(s) within the areas targeted by this assay, and inadequate number of viral copies (<250 copies / mL). A negative result must be combined with clinical observations, patient history, and epidemiological information.  Fact Sheet for Patients:   BoilerBrush.com.cy  Fact Sheet for Healthcare Providers: https://pope.com/  This test is not yet approved or  cleared by the [place] FDA and has been authorized for detection and/or diagnosis of SARS-CoV-2 by FDA under an Emergency Use Authorization (EUA).  This EUA will remain in  effect (meaning this test can be used) for the duration of the COVID-19 declaration under Section 564(b)(1) of the Act, 21 U.S.C. section 360bbb-3(b)(1), unless the authorization is terminated or revoked sooner.  Performed at [facility], [address], [place], [place] [address]          Radiology Studies: CT Code Stroke CTA Head W/WO contrast  Result Date: 03/26/2020 CLINICAL DATA:  Left-sided weakness.  Stroke suspected. EXAM: CT HEAD WITHOUT CONTRAST CT ANGIOGRAPHY OF THE HEAD AND NECK TECHNIQUE: Contiguous axial images were obtained from the base of the skull through the vertex without intravenous contrast. Multidetector CT imaging of the head and neck was performed using the standard protocol during bolus administration of intravenous contrast. Multiplanar CT image reconstructions and MIPs were obtained to evaluate the vascular anatomy. Carotid stenosis measurements (when applicable) are obtained utilizing NASCET criteria, using the distal internal carotid diameter as the denominator. CONTRAST:  42mL OMNIPAQUE IOHEXOL 350 MG/ML SOLN COMPARISON:  CT head 01/23/2020 FINDINGS: CTA NECK Aortic arch: Calcific atherosclerosis. Right carotid system: No evidence significant (greater than 50%)w stenosis or occlusion Left carotid system: Predominant calcific atherosclerosis at the bifurcation without greater than 50% narrowing. Vertebral arteries:Left dominant. The right vertebral artery is diminutive throughout its course and likely terminates as PICA. No significant stenosis of the left vertebral artery. Mild narrowing of the left vertebral artery secondary to mass effect from degenerative osteophytes. Skeleton: Multilevel degenerative changes of the cervical spine with severe degenerative disc changes at C5-C6 and C6-C7 and severe facet arthropathy at C4-C5 with degenerative anterolisthesis of C4 on C5. Degenerative pannus at the craniocervical junction. Other neck: The right thyroid gland is  atrophic or absent. CTA HEAD Anterior circulation: No evidence of significant (greater than 50%) stenosis or occlusion. No aneurysm. Posterior circulation: No evidence of significant (greater than 50%) stenosis or occlusion. No aneurysm. Venous sinuses: No evidence of dural sinus thrombosis, although evaluation is limited given arterial timing. Anatomic variants: The right ACA appears to bifurcate and there is no appreciable anterior communicating artery IMPRESSION: 1. No evidence of significant (greater than 50%) stenosis or occlusion in the head or neck. 2. Approximately 7 mm pulmonary nodule within the visualized right upper lobe. This may be infectious/inflammatory given mild surrounding opacities; however, non-contrast chest CT at 6-12 months is recommended to ensure resolution or stability. If the nodule is stable at time of repeat CT, then future CT at 18-24 months (from today's scan) is considered optional for low-risk patients, but is recommended for high-risk patients. This recommendation follows the consensus statement: Guidelines for Management of Incidental Pulmonary Nodules Detected on CT Images: From the Fleischner Society 2017; Radiology 2017; 284:228-243. Code stroke imaging results were communicated on 03/26/2020 at 9:11 am to provider Dr. Ezzie Dural  via telephone, who verbally acknowledged these results. Pulmonary nodule finding was communicated to Dr. Ezzie Dural at 9:30 am. Electronically Signed   By: Feliberto Harts MD   On: 03/26/2020 09:35   CT Code Stroke CTA Neck W/WO contrast  Result Date: 03/26/2020 CLINICAL DATA:  Left-sided weakness.  Stroke suspected. EXAM: CT HEAD WITHOUT CONTRAST CT ANGIOGRAPHY OF THE HEAD AND NECK TECHNIQUE: Contiguous axial images were obtained from the base of the skull through the vertex without intravenous contrast. Multidetector CT imaging of the head and neck was performed using the standard protocol during bolus administration of intravenous contrast. Multiplanar CT  image reconstructions and MIPs were obtained to evaluate the vascular anatomy. Carotid stenosis measurements (when applicable) are obtained utilizing NASCET criteria, using the distal internal carotid diameter as the denominator. CONTRAST:  75mL OMNIPAQUE IOHEXOL 350 MG/ML SOLN COMPARISON:  CT head 01/23/2020 FINDINGS: CTA NECK Aortic arch: Calcific atherosclerosis. Right carotid system: No evidence significant (greater than 50%)w stenosis or occlusion Left carotid system: Predominant calcific atherosclerosis at the bifurcation without greater than 50% narrowing. Vertebral arteries:Left dominant. The right vertebral artery is diminutive throughout its course and likely terminates as PICA. No significant stenosis of the left vertebral artery. Mild narrowing of the left vertebral artery secondary to mass effect from degenerative osteophytes. Skeleton: Multilevel degenerative changes of the cervical spine with severe degenerative disc changes at C5-C6 and C6-C7 and severe facet arthropathy at C4-C5 with degenerative anterolisthesis of C4 on C5. Degenerative pannus at the craniocervical junction. Other neck: The right thyroid gland is atrophic or absent. CTA HEAD Anterior circulation: No evidence of significant (greater than 50%) stenosis or occlusion. No aneurysm. Posterior circulation: No evidence of significant (greater than 50%) stenosis or occlusion. No aneurysm. Venous sinuses: No evidence of dural sinus thrombosis, although evaluation is limited given arterial timing. Anatomic variants: The right ACA appears to bifurcate and there is no appreciable anterior communicating artery IMPRESSION: 1. No evidence of significant (greater than 50%) stenosis or occlusion in the head or neck. 2. Approximately 7 mm pulmonary nodule within the visualized right upper lobe. This may be infectious/inflammatory given mild surrounding opacities; however, non-contrast chest CT at 6-12 months is recommended to ensure resolution or  stability. If the nodule is stable at time of repeat CT, then future CT at 18-24 months (from today's scan) is considered optional for low-risk patients, but is recommended for high-risk patients. This recommendation follows the consensus statement: Guidelines for Management of Incidental Pulmonary Nodules Detected on CT Images: From the Fleischner Society 2017; Radiology 2017; 284:228-243. Code stroke imaging results were communicated on 03/26/2020 at 9:11 am to provider Dr. Ezzie Dural via telephone, who verbally acknowledged these results. Pulmonary nodule finding was communicated to Dr. Ezzie Dural at 9:30 am. Electronically Signed   By: Feliberto Harts MD   On: 03/26/2020 09:35   DG CHEST PORT 1 VIEW  Result Date: 03/26/2020 CLINICAL DATA:  Right-sided weakness. EXAM: PORTABLE CHEST 1 VIEW COMPARISON:  02/15/2019 FINDINGS: Cardiac enlargement. No pleural effusion or edema. No airspace opacities identified. Review of the visualized osseous structures is unremarkable. IMPRESSION: 1. Cardiac enlargement. 2. No acute findings. Electronically Signed   By: Signa Kell M.D.   On: 03/26/2020 12:15   ECHOCARDIOGRAM COMPLETE  Result Date: 03/26/2020    ECHOCARDIOGRAM REPORT   Patient Name:   Jennifer Lowe Date of Exam: 03/26/2020 Medical Rec #:  [id number]       Height:       59.0 in Accession #:    [id number]  Weight:       225.5 lb Date of Birth:  08/09/1940       BSA:          1.941 m Patient Age:    79 years        BP:           144/67 mmHg Patient Gender: F               HR:           96 bpm. Exam Location:  Inpatient Procedure: 2D Echo, Cardiac Doppler and Color Doppler Indications:    TIA  History:        Patient has no prior history of Echocardiogram examinations.                 Abnormal ECG, Arrythmias:Atrial Fibrillation; Risk                 Factors:Hypertension, Diabetes and Dyslipidemia. History of TAVR                 procedure.                 Aortic Valve: valve is present in the aortic position.   Sonographer:    Sheralyn Boatmanina West RDCS Referring Phys: [id number] RONDELL A SMITH  Sonographer Comments: Technically difficult study due to poor echo windows and patient is morbidly obese. Image acquisition challenging due to patient body habitus. IMPRESSIONS  1. Left ventricular ejection fraction, by estimation, is 60 to 65%. The left ventricle has normal function. The left ventricle has no regional wall motion abnormalities. There is mild concentric left ventricular hypertrophy. Left ventricular diastolic function could not be evaluated.  2. Right ventricular systolic function is low normal. The right ventricular size is normal. There is mildly elevated pulmonary artery systolic pressure.  3. Left atrial size was mildly dilated.  4. The mitral valve is normal in structure. Mild mitral valve regurgitation.  5. Tricuspid valve regurgitation is mild to moderate.  6. Mild perivalvular leak in the area of the right coronary ostium is only seen in the short axis view.. The aortic valve has been repaired/replaced. Aortic valve regurgitation is not visualized. There is a valve present in the aortic position. Echo findings are consistent with normal structure and function of the aortic valve prosthesis.  7. The inferior vena cava is dilated in size with >50% respiratory variability, suggesting right atrial pressure of 8 mmHg. FINDINGS  Left Ventricle: Left ventricular ejection fraction, by estimation, is 60 to 65%. The left ventricle has normal function. The left ventricle has no regional wall motion abnormalities. The left ventricular internal cavity size was normal in size. There is  mild concentric left ventricular hypertrophy. Left ventricular diastolic function could not be evaluated due to atrial fibrillation. Left ventricular diastolic function could not be evaluated. Right Ventricle: The right ventricular size is normal. No increase in right ventricular wall thickness. Right ventricular systolic function is low normal. There  is mildly elevated pulmonary artery systolic pressure. The tricuspid regurgitant velocity is 2.91 m/s, and with an assumed right atrial pressure of 8 mmHg, the estimated right ventricular systolic pressure is 41.9 mmHg. Left Atrium: Left atrial size was mildly dilated. Right Atrium: Right atrial size was normal in size. Pericardium: There is no evidence of pericardial effusion. Mitral Valve: The mitral valve is normal in structure. Moderate mitral annular calcification. Mild mitral valve regurgitation. MV peak gradient, 12.4 mmHg. The mean mitral valve gradient is 4.0 mmHg.  Tricuspid Valve: The tricuspid valve is normal in structure. Tricuspid valve regurgitation is mild to moderate. Aortic Valve: Mild perivalvular leak in the area of the right coronary ostium is only seen in the short axis view. The aortic valve has been repaired/replaced. Aortic valve regurgitation is not visualized. Aortic valve mean gradient measures 10.0 mmHg. Aortic valve peak gradient measures 20.3 mmHg. Aortic valve area, by VTI measures 1.39 cm. There is a valve present in the aortic position. Echo findings are consistent with normal structure and function of the aortic valve prosthesis. Pulmonic Valve: The pulmonic valve was not well visualized. Pulmonic valve regurgitation is trivial. Aorta: The aortic root and ascending aorta are structurally normal, with no evidence of dilitation. Venous: The inferior vena cava is dilated in size with greater than 50% respiratory variability, suggesting right atrial pressure of 8 mmHg. IAS/Shunts: No atrial level shunt detected by color flow Doppler.  LEFT VENTRICLE PLAX 2D LVIDd:         3.30 cm LVIDs:         1.90 cm LV PW:         1.40 cm LV IVS:        1.40 cm LVOT diam:     2.30 cm LV SV:         62 LV SV Index:   32 LVOT Area:     4.15 cm  LV Volumes (MOD) LV vol d, MOD A2C: 41.1 ml LV vol d, MOD A4C: 37.9 ml LV vol s, MOD A2C: 15.8 ml LV vol s, MOD A4C: 13.1 ml LV SV MOD A2C:     25.3 ml LV SV  MOD A4C:     37.9 ml LV SV MOD BP:      25.5 ml RIGHT VENTRICLE         IVC TAPSE (M-mode): 1.4 cm  IVC diam: 2.80 cm LEFT ATRIUM             Index       RIGHT ATRIUM           Index LA diam:        4.26 cm 2.19 cm/m  RA Area:     19.50 cm LA Vol (A2C):   39.2 ml 20.20 ml/m RA Volume:   54.50 ml  28.08 ml/m LA Vol (A4C):   38.4 ml 19.78 ml/m LA Biplane Vol: 38.7 ml 19.94 ml/m  AORTIC VALVE                    PULMONIC VALVE AV Area (Vmax):    1.70 cm     PR End Diast Vel: 1.61 msec AV Area (Vmean):   1.51 cm AV Area (VTI):     1.39 cm AV Vmax:           225.50 cm/s AV Vmean:          147.000 cm/s AV VTI:            0.447 m AV Peak Grad:      20.3 mmHg AV Mean Grad:      10.0 mmHg LVOT Vmax:         92.50 cm/s LVOT Vmean:        53.600 cm/s LVOT VTI:          0.150 m LVOT/AV VTI ratio: 0.34  AORTA Ao Root diam: 3.20 cm MITRAL VALVE                TRICUSPID VALVE MV Area (  PHT): 3.23 cm     TR Peak grad:   33.9 mmHg MV Peak grad:  12.4 mmHg    TR Vmax:        291.00 cm/s MV Mean grad:  4.0 mmHg MV Vmax:       1.76 m/s     SHUNTS MV Vmean:      78.4 cm/s    Systemic VTI:  0.15 m MV Decel Time: 235 msec     Systemic Diam: 2.30 cm MV E velocity: 156.00 cm/s Rachelle Hora Croitoru MD Electronically signed by Thurmon Fair MD Signature Date/Time: 03/26/2020/3:10:20 PM    Final    CT HEAD CODE STROKE WO CONTRAST  Result Date: 03/26/2020 CLINICAL DATA:  Code stroke.  Right-sided arm/leg weakness. EXAM: CT HEAD WITHOUT CONTRAST TECHNIQUE: Contiguous axial images were obtained from the base of the skull through the vertex without intravenous contrast. COMPARISON:  CT head 01/23/2020 FINDINGS: Brain: No evidence of acute large vascular territory infarct. No acute hemorrhage. Moderate subcortical and periventricular hypodensity, nonspecific but likely the sequela of chronic microvascular ischemic disease. Similar cerebral volume loss with ex vacuo ventricular dilation. No hydrocephalus. 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 Otherwise, no mass lesion or abnormal mass effect. Vascular: Calcific atherosclerosis.  No hyperdense vessel. Skull: Normal. Negative for fracture or focal lesion. Sinuses/Orbits: No acute finding. Other: None. ASPECTS ([place] Stroke Program Early CT Score): 10 IMPRESSION: 1. No acute intracranial abnormality. Specifically, no acute hemorrhage or evidence of acute large vascular territory infarct. 2. 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 3. ASPECTS is 10 Code stroke imaging results were communicated on 03/26/2020 at 9:11 am to provider Dr. Ezzie Dural via telephone, who verbally acknowledged these results. Electronically Signed   By: Feliberto Harts MD   On: 03/26/2020 09:15        Scheduled Meds: .  stroke: mapping our early stages of recovery book   Does not apply Once  . aspirin EC  81 mg Oral Daily  . atorvastatin  80 mg Oral QPM  . diltiazem  240 mg Oral Daily  . feeding supplement (ENSURE ENLIVE)  237 mL Oral BID BM  . insulin aspart  0-15 Units Subcutaneous TID WC  . insulin aspart  0-5 Units Subcutaneous QHS  . loratadine  10 mg Oral Daily  . metoprolol succinate  100 mg Oral BID  . pantoprazole  40 mg Oral Daily  . rOPINIRole  2 mg Oral BID  . sodium chloride flush  3 mL Intravenous Once  . torsemide  20 mg Oral Daily  . torsemide  30 mg Oral q AM  . Warfarin - Pharmacist Dosing Inpatient   Does not apply q1600   Continuous Infusions: . sodium chloride Stopped (03/27/20 0713)  . cefTRIAXone (ROCEPHIN)  IV Stopped (03/27/20 0713)     LOS: 0 days    Time spent: 35 minutes    Berton Mount, MD  [facility] Pager #: [phone] 7PM-7AM contact night coverage as above

## 2020-03-27 NOTE — Progress Notes (Signed)
Pt's son called to speak with nursing about medical plans for patient. Informed him that would put a request in for them to call. Communication sent to Dr. Dartha Lodge.

## 2020-03-27 NOTE — Evaluation (Signed)
Occupational Therapy Evaluation Patient Details Name: Jennifer Lowe MRN: [id number] DOB: 23-May-1941 Today's Date: 03/27/2020    History of Present Illness 79yo female presenting with R LE weakness and L facial droop. CTH negative, MRI pending. No tpa given. Admited for further work-up. PMH obesity, A-fib, TAVR, HTN, DM, HLD   Clinical Impression   Pt admitted with the above and demonstrates the below listed deficits.  She currently demonstrates decreased activity tolerance, mild balance deficits, and generalized weakness.  She currently requires supervision to mod A for ADLs. And supervision for short distance functional mobility using RW.  She feels she is approaching her baseline.  She lives with her spouse who assists her with LB ADLs.  Recommend HHOT at discharge.  She has all needed DME.  All further OT needs can be addressed by [facility], therefore acute OT will sign off       Follow Up Recommendations  Home health OT;Supervision/Assistance - 24 hour    Equipment Recommendations  None recommended by OT    Recommendations for Other Services       Precautions / Restrictions Precautions Precautions: Fall      Mobility Bed Mobility Overal bed mobility: Needs Assistance Bed Mobility: Supine to Sit;Sit to Supine     Supine to sit: Supervision Sit to supine: Supervision      Transfers Overall transfer level: Needs assistance Equipment used: Rolling walker (2 wheeled) Transfers: Sit to/from [employer] Sit to Stand: Supervision Stand pivot transfers: Supervision            Balance Overall balance assessment: Mild deficits observed, not formally tested                                         ADL either performed or assessed with clinical judgement   ADL Overall ADL's : Needs assistance/impaired Eating/Feeding: Independent   Grooming: Wash/dry hands;Wash/dry face;Oral care;Brushing hair;Supervision/safety;Standing   Upper Body  Bathing: Set up;Sitting   Lower Body Bathing: Moderate assistance;Sit to/from stand   Upper Body Dressing : Set up;Sitting   Lower Body Dressing: Moderate assistance;Sit to/from stand   Toilet Transfer: Supervision/safety;Ambulation;Comfort height toilet;Grab bars;RW   Toileting- Clothing Manipulation and Hygiene: Sit to/from stand;Supervision/safety       Functional mobility during ADLs: Supervision/safety;Rolling walker General ADL Comments: assist to access feet      Vision Baseline Vision/History: Wears glasses Wears Glasses: At all times Patient Visual Report: No change from baseline Vision Assessment?: Yes Eye Alignment: Within Functional Limits Ocular Range of Motion: Within Functional Limits Alignment/Gaze Preference: Within Defined Limits Tracking/Visual Pursuits: Able to track stimulus in all quads without difficulty Visual Fields: No apparent deficits Additional Comments: grossly intact      Perception Perception Perception Tested?: Yes   Praxis Praxis Praxis tested?: Within functional limits    Pertinent Vitals/Pain Pain Assessment: No/denies pain Pain Score: 4  Pain Location: legs Pain Intervention(s): Repositioned     Hand Dominance Right   Extremity/Trunk Assessment Upper Extremity Assessment Upper Extremity Assessment: Generalized weakness   Lower Extremity Assessment Lower Extremity Assessment: Defer to PT evaluation   Cervical / Trunk Assessment Cervical / Trunk Assessment: Kyphotic   Communication Communication Communication: No difficulties   Cognition Arousal/Alertness: Awake/alert Behavior During Therapy: WFL for tasks assessed/performed Overall Cognitive Status: Within Functional Limits for tasks assessed  General Comments: Pt appears to be at baseline    General Comments  Pt reports she feels like she is getting back to her baseline     Exercises     Shoulder Instructions       Home Living Family/patient expects to be discharged to:: Private residence Living Arrangements: Spouse/significant other Available Help at Discharge: Family;Available 24 hours/day Type of Home: House Home Access: Ramped entrance     Home Layout: One level     Bathroom Shower/Tub: Producer, television/film/video: Standard     Home Equipment: Environmental consultant - 4 wheels;Wheelchair - power   Additional Comments: uses walker in home, uses scooter out of home  Lives With: Spouse    Prior Functioning/Environment Level of Independence: Needs assistance  Gait / Transfers Assistance Needed: Pt ambulates with with RW mod I for household distances.  She uses scooter outside the home  ADL's / Homemaking Assistance Needed: Pt is able to perform UB ADLs mod I/set up assist, toileting mod I, Requires assist for LB ADLs    Comments: utilized 3L 02 at home at all time         OT Problem List: Decreased strength;Decreased activity tolerance;Obesity;Cardiopulmonary status limiting activity      OT Treatment/Interventions:      OT Goals(Current goals can be found in the care plan section) Acute Rehab OT Goals Patient Stated Goal: to go back home  OT Goal Formulation: All assessment and education complete, DC therapy Potential to Achieve Goals: Good  OT Frequency:     Barriers to D/C:            Co-evaluation              AM-PAC OT "6 Clicks" Daily Activity     Outcome Measure Help from another person eating meals?: None Help from another person taking care of personal grooming?: A Little Help from another person toileting, which includes using toliet, bedpan, or urinal?: A Little Help from another person bathing (including washing, rinsing, drying)?: A Little Help from another person to put on and taking off regular upper body clothing?: A Little Help from another person to put on and taking off regular lower body clothing?: A Little 6 Click Score: 19   End of Session  Equipment Utilized During Treatment: Rolling walker;Gait belt;Oxygen Nurse Communication: Mobility status  Activity Tolerance: Patient limited by fatigue Patient left: in bed;with call bell/phone within reach;with bed alarm set  OT Visit Diagnosis: Unsteadiness on feet (R26.81)                Time: 1660-6301 OT Time Calculation (min): 29 min Charges:  OT General Charges $OT Visit: 1 Visit OT Evaluation $OT Eval Moderate Complexity: 1 Mod OT Treatments $Self Care/Home Management : 8-22 mins  Eber Jones., OTR/L Acute Rehabilitation Services Pager [phone] Office [phone]   Jeani Hawking M 03/27/2020, 3:07 PM

## 2020-03-27 NOTE — Progress Notes (Signed)
ANTICOAGULATION CONSULT NOTE   Pharmacy Consult for warfarin Indication: atrial fibrillation  Allergies  Allergen Reactions  . Amlodipine Besylate-Valsartan Other (See Comments)    unknown  . Morphine And Related Itching  . Amoxicillin Nausea And Vomiting  . Doxycycline Hyclate Rash    Patient Measurements: Height: 4\' 11"  (149.9 cm) Weight: 102.3 kg (225 lb 8.5 oz) IBW/kg (Calculated) : 43.2  Vital Signs: Temp: 98.1 F (36.7 C) (09/04 0814) Temp Source: Oral (09/04 0814) BP: 127/82 (09/04 0814) Pulse Rate: 96 (09/04 0814)  Labs: Recent Labs    03/26/20 0858 03/26/20 0903 03/27/20 0924  HGB 12.9 13.3  --   HCT 41.5 39.0  --   PLT 173  --   --   APTT 42*  --   --   LABPROT 33.8*  --  26.5*  INR 3.5*  --  2.5*  CREATININE 1.07* 0.90  --     Estimated Creatinine Clearance: 54.3 mL/min (by C-G formula based on SCr of 0.9 mg/dL).  Assessment: 14 yof presented to the ED as a Code Stroke. No acute abnormalities seen on CT. She is on chronic warfarin for hx afib. INR was supratherapeutic on admit at 3.5 with no bleeding noted. Warfarin was held on 9/3.  Home dose from med rec: 2.5mg  on Mon and Fri, 5mg  all other days.  9/4: INR therapeutic at 2.5 (goal 2-3).  Will re-initiate warfarin at a lower dose now that INR is therapeutic and check INR daily.   Goal of Therapy:  INR 2-3 Monitor platelets by anticoagulation protocol: Yes   Plan:  Warfarin 2.5mg  today Check daily INR and CBC  , PharmD PGY-1 Acute Care Pharmacy Resident Office: [phone] 03/27/2020 11:20 AM

## 2020-03-27 NOTE — Progress Notes (Signed)
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

## 2020-03-27 NOTE — Progress Notes (Signed)
Patient notified RN and MRI that she cannot have MRI due to implant in her body - does not have information about implant.

## 2020-03-28 ENCOUNTER — Inpatient Hospital Stay (HOSPITAL_COMMUNITY): Payer: Medicare Other

## 2020-03-28 DIAGNOSIS — R791 Abnormal coagulation profile: Secondary | ICD-10-CM

## 2020-03-28 LAB — GLUCOSE, CAPILLARY
Glucose-Capillary: 140 mg/dL — ABNORMAL HIGH (ref 70–99)
Glucose-Capillary: 210 mg/dL — ABNORMAL HIGH (ref 70–99)

## 2020-03-28 LAB — CBC
HCT: 38.2 % (ref 36.0–46.0)
Hemoglobin: 11.9 g/dL — ABNORMAL LOW (ref 12.0–15.0)
MCH: 28.4 pg (ref 26.0–34.0)
MCHC: 31.2 g/dL (ref 30.0–36.0)
MCV: 91.2 fL (ref 80.0–100.0)
Platelets: 155 10*3/uL (ref 150–400)
RBC: 4.19 MIL/uL (ref 3.87–5.11)
RDW: 14.6 % (ref 11.5–15.5)
WBC: 8.1 10*3/uL (ref 4.0–10.5)
nRBC: 0 % (ref 0.0–0.2)

## 2020-03-28 LAB — PROTIME-INR
INR: 2.4 — ABNORMAL HIGH (ref 0.8–1.2)
Prothrombin Time: 25.5 seconds — ABNORMAL HIGH (ref 11.4–15.2)

## 2020-03-28 MED ORDER — WARFARIN SODIUM 3 MG PO TABS: 3.0000 mg | ORAL_TABLET | Freq: Every day | ORAL | 11 refills | Status: AC

## 2020-03-28 MED ORDER — WARFARIN SODIUM 3 MG PO TABS
3.0000 mg | ORAL_TABLET | Freq: Once | ORAL | Status: AC
  Administered 2020-03-28: 3 mg via ORAL
  Filled 2020-03-28: qty 1

## 2020-03-28 MED ORDER — CEPHALEXIN 250 MG PO CAPS: 250.0000 mg | ORAL_CAPSULE | Freq: Four times a day (QID) | ORAL | 0 refills | Status: AC

## 2020-03-28 MED ORDER — WARFARIN SODIUM 5 MG PO TABS: 5.0000 mg | ORAL_TABLET | Freq: Once | ORAL | Status: DC

## 2020-03-28 MED ORDER — ENSURE ENLIVE PO LIQD: 237.0000 mL | Freq: Two times a day (BID) | ORAL | 12 refills | Status: DC

## 2020-03-28 NOTE — Discharge Summary (Signed)
Physician Discharge Summary  Patient ID: Jennifer Lowe MRN: [id number] DOB/AGE: 78-Nov-1942 78 y.o.  Admit date: 03/26/2020 Discharge date: 03/28/2020  Admission Diagnoses:  Discharge Diagnoses:  Principal Problem:   TIA (transient ischemic attack) Active Problems:   Supratherapeutic INR   Pulmonary nodule   Atrial fibrillation, chronic (HCC)   Obesity, Class III, BMI 40-49.9 (morbid obesity) (HCC)   Urinary tract infection   Acute CVA (cerebrovascular accident) [facility])   Discharged Condition: stable  Hospital Course: Patient is a 79 year old female with past medical history significant for atrial fibrillation on Coumadin,chronic respiratory failure on 3 L,s/p TAVR,hypertension, hyperlipidemia, diabetes mellitus type 2, GERD, and morbid obesity.  Patient was admitted with right-sided weakness, and concerns for possible TIA.  The  right-sided weakness has resolved.  UA was suggestive of UTI and urine culture was pursued.  Urine culture grew E. coli that was sensitive to ceftriaxone on cefazolin.  Patient was started on IV Rocephin.  Neurology team directed care of possible TIA.  Initial CT head was nonrevealing.  Patient code not proceed with MRI of the brain due to metallic rods.  Neurology-repeating CT head on March 28, 2020.  Repeat CT head did not show acute infarct.  Patient has been cleared for discharge by the neurology team.  Patient will followed by the PCP and neurology on discharge.   TIA versus acute CVA:  -Patient presentedwith complaints of right-sided weakness and slurred speech.  -Initial evaluation with CT scan of the brain and CT angiogram of the head and neck did not show any acute signs of a stroke or large vessel occlusion. Not a TPA candidate due to being on anticoagulation. -Neurology team was consulted to direct patient care. -Repeat CT head did not show any acute infarct. -Right-sided weakness resolved during the hospital stay.  Patient has been cleared for  discharge by the neurology team.  Patient will complete treatment of UTI on discharge.    Urinary tract infection:  Urinalysis was positive moderate leukocytes, positive nitrates, few bacteria, and greater than 50 WBCs. -Urine culture grew E. coli (please see above documentation). -Complete course of antibiotics treatment on outpatient basis.  Chronic atrial fibrillation with supratherapeutic INR: -INR was 3.5 on admission.  -Patient is rate controlled.CHA2DS2-VASc score =6 -Resumed Cardizem and metoprolol -Continue Coumadin therapy, and continue to monitor INR closely, especially as patient is currently on antibiotics.  Insulin-dependent diabetes mellitus:  Patient was managed with long-acting insulin and sliding scale insulin coverage.   -Continue optimization of diabetes mellitus treatment.    History of TAVR  Pulmonary nodule:  Incidentally found on CT angiogram of the head and neck noted to be 7 mm.  Hyperlipidemia:  -Continue atorvastatin  Morbid obesity:  BMI 45.55 kg/m -Diet and exercise.    Consults: neurology  Discharge Exam: Blood pressure 118/75, pulse 81, temperature 98.3 F (36.8 C), temperature source Oral, resp. rate 18, height 4\' 11"  (1.499 m), weight 102.3 kg, SpO2 100 %.  Disposition: Discharge disposition: 06-Home-Health Care Svc  Discharge Instructions    Diet - low sodium heart healthy   Complete by: As directed    Increase activity slowly   Complete by: As directed      Allergies as of 03/28/2020      Reactions   Amlodipine Besylate-valsartan Other (See Comments)   unknown   Morphine And Related Itching   Amoxicillin Nausea And Vomiting   Doxycycline Hyclate Rash      Medication List    TAKE these medications   acetaminophen  325 MG tablet Commonly known as: TYLENOL Take 650 mg by mouth every 6 (six) hours as needed for mild pain.   aspirin EC 81 MG tablet Take 81 mg by mouth daily. Swallow whole.   atorvastatin 80 MG  tablet Commonly known as: LIPITOR Take 80 mg by mouth every evening.   cephALEXin 250 MG capsule Commonly known as: KEFLEX Take 1 capsule (250 mg total) by mouth 4 (four) times daily for 5 days.   diltiazem 240 MG 24 hr capsule Commonly known as: CARDIZEM CD Take 240 mg by mouth daily.   feeding supplement (ENSURE ENLIVE) Liqd Take 237 mLs by mouth 2 (two) times daily between meals. Start taking on: March 29, 2020   fexofenadine 180 MG tablet Commonly known as: ALLEGRA Take 180 mg by mouth daily.   Klor-Con M20 20 MEQ tablet Generic drug: potassium chloride SA Take 20 mEq by mouth daily.   metoprolol succinate 100 MG 24 hr tablet Commonly known as: TOPROL-XL Take 100 mg by mouth 2 (two) times daily.   NovoLIN N FlexPen 100 UNIT/ML Kiwkpen Generic drug: Insulin NPH (Human) (Isophane) Inject 23-30 Units into the skin See admin instructions. Inject 30 units in the morning and 23 units at night   omeprazole 40 MG capsule Commonly known as: PRILOSEC Take 40 mg by mouth daily.   oxyCODONE-acetaminophen 5-325 MG tablet Commonly known as: PERCOCET/ROXICET Take 1 tablet by mouth every 6 (six) hours as needed for moderate pain.   rOPINIRole 2 MG tablet Commonly known as: REQUIP Take 2 mg by mouth 2 (two) times daily.   torsemide 20 MG tablet Commonly known as: DEMADEX Take 20-30 mg by mouth See admin instructions. Take 30 mg in the morning and 20 mg at 2 pm   TRELEGY ELLIPTA IN Inhale 1 puff into the lungs daily as needed (shortness of breath).   VITAMIN D3 PO Take 1 tablet by mouth daily.   VITAMIN E PO Take 1 capsule by mouth daily.   warfarin 3 MG tablet Commonly known as: Coumadin Take 1 tablet (3 mg total) by mouth daily. What changed:   medication strength  how much to take  when to take this  additional instructions       Follow-up Information    [facility] Follow up.   Specialty: Home Health Services Why: For home  health services, they will call you in the next 1-2 days to set up your first home appointment Contact information: [address] [place] [address] [phone]               Signed: Barnetta Chapel 03/28/2020, 3:07 PM

## 2020-03-28 NOTE — Discharge Instructions (Signed)

## 2020-03-28 NOTE — Progress Notes (Signed)
Patient ready for discharge to home; discharge instructions given and reviewed;Rx sent electronically;spouse and son present at bedside; patient discharged out via wheelchair with her home oxygen supply device for transport home; all belongings returned.

## 2020-03-28 NOTE — TOC Transition Note (Signed)
Transition of Care [facility]) - CM/SW Discharge Note   Patient Details  Name: Jennifer Lowe MRN: [id number] Date of Birth: [birth year]  [facility] CM/SW Contact:  Lawerance Sabal, RN Phone Number: 03/28/2020, 9:16 AM   Clinical Narrative:    Sherron Monday w patient to discuss DC plan. She would like to return home w [facility] services. Discussed HH providers and ratings, referral made to [facility]. No DME needs. Verified spouse will transport home.     Final next level of care: Home w Home Health Services Barriers to Discharge: No Barriers Identified   Patient Goals and CMS Choice Patient states their goals for this hospitalization and ongoing recovery are:: to go home CMS Medicare.gov Compare Post Acute Care list provided to:: Patient Choice offered to / list presented to : Patient  Discharge Placement                       Discharge Plan and Services                          HH Arranged: PT, OT [facility] Agency: [facility] Date [facility] Agency Contacted: 03/28/20 Time HH Agency Contacted: [phone] Representative spoke with at [facility] Agency: Britney  Social Determinants of Health (SDOH) Interventions     Readmission Risk Interventions No flowsheet data found.

## 2020-03-28 NOTE — Progress Notes (Signed)
STROKE TEAM PROGRESS NOTE   INTERVAL HISTORY No family is at the bedside.  Patient lying in bed, no acute event overnight, awake alert and not in distress. INR this am 2.4. repeat CT unremarkable. UCx showed G- rods.   OBJECTIVE Vitals:   03/27/20 1737 03/27/20 2001 03/27/20 2330 03/28/20 0411  BP: (!) 116/58 119/73 (!) 118/54 131/71  Pulse: 78 77 89 73  Resp: 18 18 17 18   Temp: 98.5 F (36.9 C) 98.6 F (37 C) 98.6 F (37 C) (!) 97.5 F (36.4 C)  TempSrc: Oral Oral  Oral  SpO2: 100% 100% 100% 100%  Weight:      Height:        CBC:  Recent Labs  Lab 03/26/20 0858 03/26/20 0858 03/26/20 0903 03/28/20 0416  WBC 9.9  --   --  8.1  NEUTROABS 7.2  --   --   --   HGB 12.9   < > 13.3 11.9*  HCT 41.5   < > 39.0 38.2  MCV 92.6  --   --  91.2  PLT 173  --   --  155   < > = values in this interval not displayed.    Basic Metabolic Panel:  Recent Labs  Lab 03/26/20 0858 03/26/20 0903  NA 141 140  K 3.8 3.9  CL 100 98  CO2 29  --   GLUCOSE 146* 140*  BUN 13 17  CREATININE 1.07* 0.90  CALCIUM 9.2  --     Lipid Panel:     Component Value Date/Time   CHOL 135 03/27/2020 0924   TRIG 191 (H) 03/27/2020 0924   HDL 31 (L) 03/27/2020 0924   CHOLHDL 4.4 03/27/2020 0924   VLDL 38 03/27/2020 0924   LDLCALC 66 03/27/2020 0924   HgbA1c:  Lab Results  Component Value Date   HGBA1C 6.9 (H) 03/26/2020   Urine Drug Screen: No results found for: LABOPIA, COCAINSCRNUR, LABBENZ, AMPHETMU, THCU, LABBARB  Alcohol Level No results found for: [facility]  IMAGING  CT Code Stroke CTA Head W/WO contrast CT Code Stroke CTA Neck W/WO contrast 03/26/2020 IMPRESSION:  1. No evidence of significant (greater than 50%) stenosis or occlusion in the head or neck.  2. Approximately 7 mm pulmonary nodule within the visualized right upper lobe. This may be infectious/inflammatory given mild surrounding opacities; however, non-contrast chest CT at 6-12 months is recommended to ensure resolution or  stability. If the nodule is stable at time of repeat CT, then future CT at 18-24 months (from today's scan) is considered optional for low-risk patients, but is recommended for high-risk patients. This recommendation follows the consensus statement: Guidelines for Management of Incidental Pulmonary Nodules Detected on CT Images: From the Fleischner Society 2017; Radiology 2017; 284:228-243.   DG CHEST PORT 1 VIEW 03/26/2020 IMPRESSION:  1. Cardiac enlargement.  2. No acute findings.   ECHOCARDIOGRAM COMPLETE  03/26/2020 IMPRESSIONS   1. Left ventricular ejection fraction, by estimation, is 60 to 65%. The left ventricle has normal function. The left ventricle has no regional wall motion abnormalities. There is mild concentric left ventricular hypertrophy. Left ventricular diastolic function could not be evaluated.   2. Right ventricular systolic function is low normal. The right ventricular size is normal. There is mildly elevated pulmonary artery systolic pressure.   3. Left atrial size was mildly dilated.   4. The mitral valve is normal in structure. Mild mitral valve regurgitation.   5. Tricuspid valve regurgitation is mild to moderate.  6. Mild perivalvular leak in the area of the right coronary ostium is only seen in the short axis view.. The aortic valve has been repaired/replaced. Aortic valve regurgitation is not visualized. There is a valve present in the aortic position. Echo findings are consistent with normal structure and function of the aortic valve prosthesis.   7. The inferior vena cava is dilated in size with >50% respiratory variability, suggesting right atrial pressure of 8 mmHg.   CT HEAD CODE STROKE WO CONTRAST 03/26/2020 IMPRESSION:  1. No acute intracranial abnormality. Specifically, no acute hemorrhage or evidence of acute large vascular territory infarct.  2. 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  3. ASPECTS is 10    CT HEAD WO CONTRAST  Result Date: 03/28/2020 CLINICAL DATA:  Stroke follow-up. EXAM: CT HEAD WITHOUT CONTRAST TECHNIQUE: Contiguous axial images were obtained from the base of the skull through the vertex without intravenous contrast. COMPARISON:  Two days ago FINDINGS: Brain: No evidence of acute infarction, hemorrhage, hydrocephalus, extra-axial collection or mass lesion/mass effect. Small dural based calcification along the left parietal convexity, considered incidental. Mild for age cerebral volume loss and periventricular chronic small vessel ischemia. Vascular: No hyperdense vessel or unexpected calcification. Skull: Normal. Negative for fracture or focal lesion. Sinuses/Orbits: Negative IMPRESSION: Stable head CT.  No hemorrhage or detectable infarct. Electronically Signed   By: Marnee Spring M.D.   On: 03/28/2020 09:53    ECG - atrial fibrillation - ventricular response 97 BPM (See cardiology reading for complete details)   PHYSICAL EXAM  Temp:  [97.5 F (36.4 C)-98.6 F (37 C)] 97.5 F (36.4 C) (09/05 0411) Pulse Rate:  [73-96] 73 (09/05 0411) Resp:  [17-20] 18 (09/05 0411) BP: (116-135)/(54-97) 131/71 (09/05 0411) SpO2:  [99 %-100 %] 100 % (09/05 0411)  General - Well nourished, well developed, in no apparent distress.  Ophthalmologic - fundi not visualized due to noncooperation.  Cardiovascular - irregularly irregular heart rate and rhythm.  Neuro - awake alert, orientated x3.  No aphasia, follows simple commands, able to name and repeat.  Visual fields are full, EOMI, PERRL. Facial symmetrical, tongue midline.  Moving all extremities symmetrically.  Sensation symmetrical, finger-to-nose grossly intact.  Gait not tested.   ASSESSMENT/PLAN [name] is a 79 y.o. female with history of PAF and TAVR on warfarin with goal INR of 2.0-3.0 (INR 3.5 on admission), HTN, DM2, obesity, chronic respiratory failure, and HLD who presents with acute onset R leg weakness /  tremor and left facial droop. She did not receive IV t-PA due to therapeutic warfarin level.  Most likely encephalopathy due to UTI  CT Head - No acute intracranial abnormality.   MRI head - not compatible with MRI  CT repeat no acute finding  CTA H&N - No evidence of significant (greater than 50%) stenosis or occlusion in the head or neck.   2D Echo - EF 60 - 65%. No cardiac source of emboli identified.   Sars Corona Virus 2 - negative  LDL - 66  HgbA1c - 6.9  INR 3.5-> 2.5->2.4  VTE prophylaxis - warfarin  aspirin 81 mg daily and warfarin daily prior to admission, now on aspirin 81 mg daily and warfarin daily.  Continue on discharge  Patient counseled to be compliant with her antithrombotic medications  Ongoing aggressive stroke risk factor management  Therapy recommendations:  HH PT  Disposition:  Pending  UTI  UA WBC > 50  On Rocephin  Urine culture  G- rods, sensitivity pending  A. Fib  On home Coumadin  INR 3.5-> 2.5->2.4  Continue Coumadin  INR goal 2.0-3.0  Hypertension  Home BP meds: diltiazem ; metoprolol  Current BP meds: diltiazem ; metoprolol  Stable . Long-term BP goal normotensive  Hyperlipidemia  Home Lipid lowering medication: Lipitor 80 mg daily   LDL 66, goal < 70  Current lipid lowering medication: Lipitor 80 mg daily   Continue statin at discharge  Diabetes, controlled  Home diabetic meds: insulin  Current diabetic meds: insulin  HgbA1c 6.9, goal < 7.0  SSI  CBG monitoring  Close PCP follow-up  Other Stroke Risk Factors  Advanced age  Obesity, Body mass index is 45.55 kg/m., recommend weight loss, diet and exercise as appropriate    S/p TAVR  Other Active Problems  Code status - Full code  7 mm pulmonary nodule within the visualized right upper lobe. This may be infectious/inflammatory given mild surrounding opacities; however, non-contrast chest CT at 6-12 months is recommended  CKD - stage 3a -  creatinine - 1.07->0.90  Hospital day # 1  Neurology will sign off. Please call with questions. No neuro follow up needed at this time. Thanks for the consult.  Marvel Plan, MD PhD Stroke Neurology 03/28/2020 12:40 PM     To contact Stroke Continuity provider, please refer to [email or web address]. After hours, contact General Neurology

## 2020-03-28 NOTE — Progress Notes (Addendum)
ANTICOAGULATION CONSULT NOTE   Pharmacy Consult for warfarin Indication: atrial fibrillation  Allergies  Allergen Reactions  . Amlodipine Besylate-Valsartan Other (See Comments)    unknown  . Morphine And Related Itching  . Amoxicillin Nausea And Vomiting  . Doxycycline Hyclate Rash    Patient Measurements: Height: 4\' 11"  (149.9 cm) Weight: 102.3 kg (225 lb 8.5 oz) IBW/kg (Calculated) : 43.2  Vital Signs: Temp: 98.3 F (36.8 C) (09/05 0758) Temp Source: Oral (09/05 0758) BP: 106/50 (09/05 0758) Pulse Rate: 85 (09/05 0758)  Labs: Recent Labs    03/26/20 0858 03/26/20 0858 03/26/20 0903 03/27/20 0924 03/28/20 0416  HGB 12.9   < > 13.3  --  11.9*  HCT 41.5  --  39.0  --  38.2  PLT 173  --   --   --  155  APTT 42*  --   --   --   --   LABPROT 33.8*  --   --  26.5* 25.5*  INR 3.5*  --   --  2.5* 2.4*  CREATININE 1.07*  --  0.90  --   --    < > = values in this interval not displayed.    Estimated Creatinine Clearance: 54.3 mL/min (by C-G formula based on SCr of 0.9 mg/dL).  Assessment: 72 yof presented to the ED as a Code Stroke. No acute abnormalities seen on CT. She is on chronic warfarin for hx afib. INR was supratherapeutic on admit at 3.5 with no bleeding noted. Warfarin was held on 9/3. Ok to continue warfarin per neurology.   Home dose from med rec: 2.5mg  on Mon and Fri, 5mg  all other days.  9/5: INR continues to be therapeutic at 2.4. Will give warfarin 3mg  today at request of physician and patient will d/c.  Patient was instructed to contact her INR clinic to get her INR checked Tuesday. Patient verbalized understanding  Goal of Therapy:  INR 2-3 Monitor platelets by anticoagulation protocol: Yes   Plan:  Warfarin 3mg  today before discharge Will be sent home w/ warfarin 3mg  tablets Patient to contact her clinic on Tuesday for INR check  , PharmD PGY-1 Acute Care Pharmacy Resident Office: [phone] 03/28/2020 9:26 AM

## 2020-03-29 LAB — URINE CULTURE: Culture: >=100000 — AB

## 2020-12-22 ENCOUNTER — Other Ambulatory Visit: Payer: Self-pay

## 2020-12-22 ENCOUNTER — Emergency Department: Payer: Medicare Other

## 2020-12-22 ENCOUNTER — Emergency Department
Admission: EM | Admit: 2020-12-22 | Discharge: 2020-12-22 | Disposition: A | Discharge status: Home / Self Care | Payer: Medicare Other | Attending: Emergency Medicine | Admitting: Emergency Medicine

## 2020-12-22 ENCOUNTER — Encounter: Payer: Self-pay | Admitting: Emergency Medicine

## 2020-12-22 DIAGNOSIS — Z20822 Contact with and (suspected) exposure to covid-19: Secondary | ICD-10-CM | POA: Diagnosis not present

## 2020-12-22 DIAGNOSIS — I10 Essential (primary) hypertension: Secondary | ICD-10-CM | POA: Insufficient documentation

## 2020-12-22 DIAGNOSIS — J181 Lobar pneumonia, unspecified organism: Secondary | ICD-10-CM | POA: Insufficient documentation

## 2020-12-22 DIAGNOSIS — Z794 Long term (current) use of insulin: Secondary | ICD-10-CM | POA: Insufficient documentation

## 2020-12-22 DIAGNOSIS — Z7982 Long term (current) use of aspirin: Secondary | ICD-10-CM | POA: Insufficient documentation

## 2020-12-22 DIAGNOSIS — R790 Abnormal level of blood mineral: Secondary | ICD-10-CM | POA: Insufficient documentation

## 2020-12-22 DIAGNOSIS — Z79899 Other long term (current) drug therapy: Secondary | ICD-10-CM | POA: Diagnosis not present

## 2020-12-22 DIAGNOSIS — R0602 Shortness of breath: Secondary | ICD-10-CM

## 2020-12-22 DIAGNOSIS — E119 Type 2 diabetes mellitus without complications: Secondary | ICD-10-CM | POA: Diagnosis not present

## 2020-12-22 DIAGNOSIS — R609 Edema, unspecified: Secondary | ICD-10-CM | POA: Diagnosis not present

## 2020-12-22 DIAGNOSIS — R531 Weakness: Secondary | ICD-10-CM | POA: Insufficient documentation

## 2020-12-22 DIAGNOSIS — K5730 Diverticulosis of large intestine without perforation or abscess without bleeding: Secondary | ICD-10-CM | POA: Insufficient documentation

## 2020-12-22 DIAGNOSIS — J189 Pneumonia, unspecified organism: Secondary | ICD-10-CM

## 2020-12-22 LAB — RESP PANEL BY RT-PCR (FLU A&B, COVID) ARPGX2
Influenza A by PCR: NEGATIVE
Influenza B by PCR: NEGATIVE
SARS Coronavirus 2 by RT PCR: NEGATIVE

## 2020-12-22 LAB — CBC
HCT: 38.8 % (ref 36.0–46.0)
Hemoglobin: 12.5 g/dL (ref 12.0–15.0)
MCH: 29.6 pg (ref 26.0–34.0)
MCHC: 32.2 g/dL (ref 30.0–36.0)
MCV: 91.9 fL (ref 80.0–100.0)
Platelets: 227 10*3/uL (ref 150–400)
RBC: 4.22 MIL/uL (ref 3.87–5.11)
RDW: 14.1 % (ref 11.5–15.5)
WBC: 13.3 10*3/uL — ABNORMAL HIGH (ref 4.0–10.5)
nRBC: 0.2 % (ref 0.0–0.2)

## 2020-12-22 LAB — BASIC METABOLIC PANEL
Anion gap: 12 (ref 5–15)
BUN: 29 mg/dL — ABNORMAL HIGH (ref 8–23)
CO2: 33 mmol/L — ABNORMAL HIGH (ref 22–32)
Calcium: 8.9 mg/dL (ref 8.9–10.3)
Chloride: 95 mmol/L — ABNORMAL LOW (ref 98–111)
Creatinine, Ser: 1.03 mg/dL — ABNORMAL HIGH (ref 0.44–1.00)
GFR, Estimated: 56 mL/min — ABNORMAL LOW (ref >60)
Glucose, Bld: 269 mg/dL — ABNORMAL HIGH (ref 70–99)
Potassium: 3.5 mmol/L (ref 3.5–5.1)
Sodium: 140 mmol/L (ref 135–145)

## 2020-12-22 LAB — TROPONIN I (HIGH SENSITIVITY)
Troponin I (High Sensitivity): 11 ng/L (ref <18)
Troponin I (High Sensitivity): 10 ng/L (ref <18)

## 2020-12-22 LAB — BRAIN NATRIURETIC PEPTIDE: B Natriuretic Peptide: 225.7 pg/mL — ABNORMAL HIGH (ref 0.0–100.0)

## 2020-12-22 LAB — PROCALCITONIN: Procalcitonin: <0.1 ng/mL

## 2020-12-22 MED ORDER — AZITHROMYCIN 250 MG PO TABS: ORAL_TABLET | ORAL | 0 refills | Status: AC

## 2020-12-22 MED ORDER — CEFPODOXIME PROXETIL 200 MG PO TABS: 200.0000 mg | ORAL_TABLET | Freq: Two times a day (BID) | ORAL | 0 refills | Status: AC

## 2020-12-22 NOTE — ED Notes (Signed)
See triage note, pt reports increased chest pressure, shob, peripheral edema for the past few days. Increased lasix the past 5 days with no relief

## 2020-12-22 NOTE — ED Notes (Signed)
EDP requested pt get ambulatory O2 sats. This RN and Product manager assisted pt with walker in room and pt maintained 95% saturation. EDP notified.

## 2020-12-22 NOTE — ED Triage Notes (Signed)
Pt comes into the ED via POV c/o cough and increased peripheral swelling.  PT has h/o COPD, CHF, and edema.  Pt able to speak in full sentences at this time and chronically wear 4L of O2.  PT in NAD with even and unlabored respirations.  Pt does have some chest discomfort.

## 2020-12-22 NOTE — ED Provider Notes (Signed)
[facility] Emergency Department Provider Note  ____________________________________________   Event Date/Time   First MD Initiated Contact with Patient 12/22/20 1813     (approximate)  I have reviewed the triage vital signs and the nursing notes.   HISTORY  Chief Complaint Cough and Edema    HPI Jennifer CleverlyBonnie Linson is a 80 y.o. female, diabetes, hypertension, hyperlipidemia, COPD, CHF on 4 L of oxygen at baseline who comes in for cough, increased swelling in her legs.  Patient reports increased chest pressure, shortness of breath and peripheral edema over the past few days.  They have tried to increase her Lasix to 40 mg twice daily over the past 5 days without relief.  They also report that she has been on a course of steroids for a cough and her wheezing.  Patient just stopped the course of steroids yesterday.  She states that the main reason she is here today is because her husband made her come in because he felt like she was a little bit more weak with walking.  She states that now she is feeling much better and does not feel as weak as earlier.  She denies any falls, hitting her head.  INR done today was 2.2       Past Medical History:  Diagnosis Date  . Atrial fibrillation (HCC)   . Chronic respiratory failure (HCC)   . Diabetes mellitus type 2 with complications (HCC)   . Hyperlipidemia   . Hypertension   . Morbid obesity [facility](HCC)     Patient Active Problem List   Diagnosis Date Noted  . Acute CVA (cerebrovascular accident) (HCC) 03/27/2020  . TIA (transient ischemic attack) 03/26/2020  . Supratherapeutic INR 03/26/2020  . Pulmonary nodule 03/26/2020  . Atrial fibrillation, chronic (HCC) 03/26/2020  . Obesity, Class III, BMI 40-49.9 (morbid obesity) (HCC) 03/26/2020  . Urinary tract infection 03/26/2020    Past Surgical History:  Procedure Laterality Date  . ANOMALOUS PULMONARY VENOUS RETURN REPAIR, TOTAL    . BACK SURGERY     L4-5  .  VAGINAL PROLAPSE REPAIR      Prior to Admission medications   Medication Sig Start Date End Date Taking? Authorizing Provider  acetaminophen (TYLENOL) 325 MG tablet Take 650 mg by mouth every 6 (six) hours as needed for mild pain.    [provider]  aspirin EC 81 MG tablet Take 81 mg by mouth daily. Swallow whole.    [provider]  atorvastatin (LIPITOR) 80 MG tablet Take 80 mg by mouth every evening. 01/02/20   [provider]  Cholecalciferol (VITAMIN D3 PO) Take 1 tablet by mouth daily.    [provider]  diltiazem (CARDIZEM CD) 240 MG 24 hr capsule Take 240 mg by mouth daily.    [provider]  feeding supplement, ENSURE ENLIVE, (ENSURE ENLIVE) LIQD Take 237 mLs by mouth 2 (two) times daily between meals. 03/29/20   Barnetta Chapelgbata, Sylvester I, MD  fexofenadine (ALLEGRA) 180 MG tablet Take 180 mg by mouth daily.    [provider]  Fluticasone-Umeclidin-Vilant (TRELEGY ELLIPTA IN) Inhale 1 puff into the lungs daily as needed (shortness of breath).     [provider]  KLOR-CON M20 20 MEQ tablet Take 20 mEq by mouth daily. 02/27/20   [provider]  metoprolol succinate (TOPROL-XL) 100 MG 24 hr tablet Take 100 mg by mouth 2 (two) times daily. 01/29/20   [provider]  NOVOLIN N FLEXPEN 100 UNIT/ML Kiwkpen Inject 23-30  Units into the skin See admin instructions. Inject 30 units in the morning and 23 units at night 02/27/20   [provider]  omeprazole (PRILOSEC) 40 MG capsule Take 40 mg by mouth daily.  03/22/20   [provider]  oxyCODONE-acetaminophen (PERCOCET/ROXICET) 5-325 MG tablet Take 1 tablet by mouth every 6 (six) hours as needed for moderate pain.  03/18/20   [provider]  rOPINIRole (REQUIP) 2 MG tablet Take 2 mg by mouth 2 (two) times daily. 01/17/20   [provider]  torsemide (DEMADEX) 20 MG tablet Take 20-30 mg by mouth See admin instructions. Take 30 mg in the morning  and 20 mg at 2 pm 01/30/20   [provider]  VITAMIN E PO Take 1 capsule by mouth daily.    [provider]  warfarin (COUMADIN) 3 MG tablet Take 1 tablet (3 mg total) by mouth daily. 03/28/20 03/28/21  Berton Mount I, MD    Allergies Amlodipine besylate-valsartan, Morphine and related, Amoxicillin, and Doxycycline hyclate  Family History  Problem Relation Age of Onset  . Hypertension Mother   . Diabetes Mother   . Hypertension Sister   . Diabetes Sister   . Hypertension Brother   . Diabetes Brother     Social History Social History   Tobacco Use  . Smoking status: Never Smoker  . Smokeless tobacco: Never Used      Review of Systems Constitutional: No fever/chills Eyes: No visual changes. ENT: No sore throat. Cardiovascular: Positive chest pain Respiratory: Shortness of breath, cough Gastrointestinal: No abdominal pain.  No nausea, no vomiting.  No diarrhea.  No constipation. Genitourinary: Negative for dysuria. Musculoskeletal: Negative for back pain.  Leg edema Skin: Negative for rash. Neurological: Negative for headaches, focal weakness or numbness. All other ROS negative ____________________________________________   PHYSICAL EXAM:  VITAL SIGNS: ED Triage Vitals  Enc Vitals Group     BP 12/22/20 1635 91/83     Pulse Rate 12/22/20 1635 (!) 111     Resp 12/22/20 1635 20     Temp 12/22/20 1635 98.7 F (37.1 C)     Temp Source 12/22/20 1635 Oral     SpO2 12/22/20 1635 94 %     Weight 12/22/20 1636 220 lb (99.8 kg)     Height 12/22/20 1636 4\' 11"  (1.499 m)     Head Circumference --      Peak Flow --      Pain Score 12/22/20 1636 8     Pain Loc --      Pain Edu? --      Excl. in GC? --     Constitutional: Alert and oriented. Well appearing and in no acute distress. Eyes: Conjunctivae are normal. EOMI. Head: Atraumatic. Nose: No congestion/rhinnorhea. Mouth/Throat: Mucous membranes are moist.   Neck: No stridor. Trachea Midline.  FROM Cardiovascular: Normal rate, regular rhythm. Grossly normal heart sounds.  Good peripheral circulation. Respiratory:   Mild wheezing noted, no increased work of breathing, on baseline 4 L Gastrointestinal: Soft and nontender. No distention. No abdominal bruits.  Musculoskeletal: 1+ edema bilaterally.  No joint effusions. Neurologic:  Normal speech and language. No gross focal neurologic deficits are appreciated.  Some mild right arm and right leg weakness that patient states has been chronic for her been going on for years. Skin:  Skin is warm, dry and intact. No rash noted. Psychiatric: Mood and affect are normal. Speech and behavior are normal. GU: Deferred   ____________________________________________   LABS (all  labs ordered are listed, but only abnormal results are displayed)  Labs Reviewed  BASIC METABOLIC PANEL - Abnormal; Notable for the following components:      Result Value   Chloride 95 (*)    CO2 33 (*)    Glucose, Bld 269 (*)    BUN 29 (*)    Creatinine, Ser 1.03 (*)    GFR, Estimated 56 (*)    All other components within normal limits  CBC - Abnormal; Notable for the following components:   WBC 13.3 (*)    All other components within normal limits  BRAIN NATRIURETIC PEPTIDE  TROPONIN I (HIGH SENSITIVITY)  TROPONIN I (HIGH SENSITIVITY)   ____________________________________________   ED ECG REPORT I, Concha Se, the attending physician, personally viewed and interpreted this ECG.  Atrial fibrillation rate of 105, no ST elevation, no T wave version but aVL, normal intervals ____________________________________________  RADIOLOGY Vela Prose, personally viewed and evaluated these images (plain radiographs) as part of my medical decision making, as well as reviewing the written report by the radiologist.  ED MD interpretation: No pneumonia  Official radiology report(s): DG Chest 2 View  Result Date: 12/22/2020 CLINICAL DATA:  Chest pain and  shortness of breath EXAM: CHEST - 2 VIEW COMPARISON:  12/16/2020 FINDINGS: Cardiac shadow is enlarged. 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 Patient rotation to the right accentuates the mediastinal markings. The lungs are clear. No acute bony abnormality is noted. IMPRESSION: No acute abnormality noted. Electronically Signed   By: Alcide Clever M.D.   On: 12/22/2020 17:23   CT Head Wo Contrast  Result Date: 12/22/2020 CLINICAL DATA:  Neurologic deficit, right-sided weakness, cough, swelling EXAM: CT HEAD WITHOUT CONTRAST TECHNIQUE: Contiguous axial images were obtained from the base of the skull through the vertex without intravenous contrast. COMPARISON:  03/28/2020 FINDINGS: Brain: Stable hypodensities throughout the periventricular white matter consistent with chronic small vessel ischemic change. No evidence of acute infarct or hemorrhage. Lateral ventricles and midline structures are stable. No acute extra-axial fluid collections. No mass effect. Vascular: No hyperdense vessel or unexpected calcification. Skull: Normal. Negative for fracture or focal lesion. Sinuses/Orbits: Mucosal thickening ethmoid air cells. Remaining sinuses are clear. Other: None. IMPRESSION: 1. No acute intracranial process. Stable chronic small vessel ischemic changes within the white matter. Electronically Signed   By: Sharlet Salina M.D.   On: 12/22/2020 20:07   CT CHEST ABDOMEN PELVIS WO CONTRAST  Result Date: 12/22/2020 CLINICAL DATA:  Shortness of breath, increased peripheral swelling EXAM: CT CHEST, ABDOMEN AND PELVIS WITHOUT CONTRAST TECHNIQUE: Multidetector CT imaging of the chest, abdomen and pelvis was performed following the standard protocol without IV contrast. Unenhanced CT was performed per clinician order. Lack of IV contrast limits sensitivity and specificity, especially for evaluation of vascular structures and abdominal/pelvic solid viscera. COMPARISON:  12/31/2016, 12/22/2020 FINDINGS: CT CHEST FINDINGS  Cardiovascular: Unenhanced imaging of the heart demonstrates no pericardial effusion. Stable aortic valve prosthesis. Prominent atherosclerosis of the thoracic aorta. Evaluation of the aortic lumen is limited without IV contrast. Mediastinum/Nodes: No enlarged mediastinal, hilar, or axillary lymph nodes. Thyroid gland, trachea, and esophagus demonstrate no significant findings. Lungs/Pleura: Minimal nodular ground-glass airspace disease is seen within the right upper lobe abutting the major fissure, likely inflammatory or infectious. Largest nodular area measures approximately 4 mm. No effusion or pneumothorax. The central airways are patent. Musculoskeletal: No acute or destructive bony lesions. Reconstructed images demonstrate no additional findings. CT ABDOMEN PELVIS FINDINGS Hepatobiliary: Unenhanced imaging of the  liver and gallbladder demonstrate no focal abnormalities. No biliary dilation. Pancreas: Unremarkable. No pancreatic ductal dilatation or surrounding inflammatory changes. Spleen: Normal in size without focal abnormality. Adrenals/Urinary Tract: There is a 7 mm nonobstructing calculus within the lower pole left kidney. No other urinary tract calculi or signs of obstruction. 1 cm hyperdense cyst is seen within the upper pole right kidney. The adrenals are unremarkable. The bladder is grossly normal. Stomach/Bowel: No bowel obstruction or ileus. Scattered distal colonic diverticulosis without diverticulitis. A gas-filled appendix is seen in the right lower quadrant. No bowel wall thickening or inflammatory change. Vascular/Lymphatic: Extensive atherosclerosis of the abdominal aorta and its branches. No pathologic adenopathy. Reproductive: Status post hysterectomy. No adnexal masses. Other: No free fluid or free gas. Small fat containing umbilical hernia. No bowel herniation. Musculoskeletal: No acute or destructive bony lesions. Posterior fusion and discectomy at L4-5. There is severe multilevel  thoracolumbar spondylosis greatest from L1 through L4. Reconstructed images demonstrate no additional findings. IMPRESSION: 1. Minimal nodular ground-glass consolidation within the right upper lobe, likely inflammatory or infectious. Non-contrast chest CT at 3-6 months is recommended. If nodules persist and are stable at that time, consider additional non-contrast chest CT examinations at 2 and 4 years. This recommendation follows the consensus statement: Guidelines for Management of Incidental Pulmonary Nodules Detected on CT Images: From the Fleischner Society 2017; Radiology 2017; 284:228-243. 2. Nonobstructing 7 mm left renal calculus. 3. Minimal colonic diverticulosis without diverticulitis. 4. Small fat containing umbilical hernia. 5.  Aortic Atherosclerosis (ICD10-I70.0). Electronically Signed   By: Sharlet Salina M.D.   On: 12/22/2020 20:18    ____________________________________________   PROCEDURES  Procedure(s) performed (including Critical Care):  Procedures   ____________________________________________   INITIAL IMPRESSION / ASSESSMENT AND PLAN / ED COURSE   Zandrea Kenealy was evaluated in Emergency Department on 12/22/2020 for the symptoms described in the history of present illness. She was evaluated in the context of the global COVID-19 pandemic, which necessitated consideration that the patient might be at risk for infection with the SARS-CoV-2 virus that causes COVID-19. Institutional protocols and algorithms that pertain to the evaluation of patients at risk for COVID-19 are in a state of rapid change based on information released by regulatory bodies including the CDC and federal and state organizations. These policies and algorithms were followed during the patient's care in the ED.    Most Likely DDx:  -Patient does have some mild edema in her legs so we will get labs to evaluate for CHF, COVID, pneumonia, given the wheezing consider COPD  We will give 3 duo nebs,  steroids   DDx that was also considered d/t potential to cause harm, but was found less likely based on history and physical (as detailed above): -PNA (no fevers, cough but CXR to evaluate) -PNX (reassured with equal b/l breath sounds, CXR to evaluate) -Symptomatic anemia (will get H&H) -Pulmonary embolism as no sob at rest, not pleuritic in nature, no hypoxia -Aortic Dissection as no tearing pain and no radiation to the mid back, pulses equal -Pericarditis no rub on exam, EKG changes or hx to suggest dx -Tamponade (no notable SOB, tachycardic, hypotensive) -Esophageal rupture (no h/o diffuse vomitting/no crepitus)  Cardiac markers are negative x2 BNP is elevated at 225 White count is elevated but she was recently on steroids Orthostatics are negative.  Chest x-ray was normal.  Given patient's continued symptoms we will get CT scans to further evaluate.  Patient does report having some abdominal pain so we will get a  CT of that as well as given her weakness will get CT head to make sure no evidence of intercranial hemorrhage  CT imaging was negative except there was concern for a possible groundglass opacity in the right upper lobe.  Have recommended a repeat CT head in 3 to 6 months.  I did discuss this with patient and she expressed understanding.  We will provide a copy of report  Patient's white count is elevated but again this could be from steroids.  Her procalcitonin is negative but given her continued symptoms I think it be reasonable to start her on a course of antibiotics.  There is no pulmonary edema and her BNP is only slightly elevated I discussed with him holding off on doing her Lasix twice daily and going back to what she was on previously.  Her wheezing is improved after treatments.   There is concern that azithromycin can increase warfarin levels- let pt know to f/u with INR monitors.  I discussed with our pharmacist here who recommended that she discuss an earlier INR  check.  Discussed with patient admission versus going home depends on her symptoms.  Patient states that she is feeling much better would like to go home.  We will do an ambulatory trial and if she does okay we will discharge patient home with antibiotics.  We will hold off on repeat steroids given she just recently got them and her sugars are elevated due to her diabetes.  Patient did well ambulating and feels comfortable with discharge home.     ____________________________________________   FINAL CLINICAL IMPRESSION(S) / ED DIAGNOSES   Final diagnoses:  SOB (shortness of breath)  Community acquired pneumonia of right upper lobe of lung     MEDICATIONS GIVEN DURING THIS VISIT:  Medications - No data to display   ED Discharge Orders         Ordered    cefpodoxime (VANTIN) 200 MG tablet  2 times daily        12/22/20 2139    azithromycin (ZITHROMAX Z-PAK) 250 MG tablet        12/22/20 2139           Note:  This document was prepared using Dragon voice recognition software and may include unintentional dictation errors.   Concha Se, MD 12/22/20 [phone]

## 2020-12-22 NOTE — ED Notes (Signed)
2 RN attempt IV with no success

## 2020-12-22 NOTE — Discharge Instructions (Addendum)
We are starting you on 2 different antibiotics to help with a possible infection.  We did have to put you on azithromycin which can affect your warfarin levels so you should monitor carefully for any bleeding and let your team know who checks your INR that you are on this medications to make sure that they are okay with it.  You should take all your nighttime medications when you get home.  I would go back to taking your Lasix as previously prescribed given there is no fluid on your lungs.  Return to the ER for worsening shortness of breath, fevers or any other concerns  Your CT scan is as below   IMPRESSION:  1. Minimal nodular ground-glass consolidation within the right upper  lobe, likely inflammatory or infectious. Non-contrast chest CT at  3-6 months is recommended. If nodules persist and are stable at that  time, consider additional non-contrast chest CT examinations at 2  and 4 years. This recommendation follows the consensus statement:  Guidelines for Management of Incidental Pulmonary Nodules Detected  on CT Images: From the Fleischner Society 2017; Radiology 2017;  284:228-243.  2. Nonobstructing 7 mm left renal calculus.  3. Minimal colonic diverticulosis without diverticulitis.  4. Small fat containing umbilical hernia.  5.  Aortic Atherosclerosis (ICD10-I70.0).

## 2021-01-16 ENCOUNTER — Emergency Department (HOSPITAL_COMMUNITY)
Admission: EM | Admit: 2021-01-16 | Discharge: 2021-01-16 | Disposition: A | Discharge status: Home / Self Care | Payer: Medicare Other | Attending: Emergency Medicine | Admitting: Emergency Medicine

## 2021-01-16 ENCOUNTER — Emergency Department (HOSPITAL_COMMUNITY): Payer: Medicare Other

## 2021-01-16 ENCOUNTER — Encounter (HOSPITAL_COMMUNITY): Payer: Self-pay

## 2021-01-16 ENCOUNTER — Other Ambulatory Visit: Payer: Self-pay

## 2021-01-16 DIAGNOSIS — T1490XA Injury, unspecified, initial encounter: Secondary | ICD-10-CM

## 2021-01-16 DIAGNOSIS — W010XXA Fall on same level from slipping, tripping and stumbling without subsequent striking against object, initial encounter: Secondary | ICD-10-CM | POA: Insufficient documentation

## 2021-01-16 DIAGNOSIS — Z7901 Long term (current) use of anticoagulants: Secondary | ICD-10-CM | POA: Insufficient documentation

## 2021-01-16 DIAGNOSIS — Z20822 Contact with and (suspected) exposure to covid-19: Secondary | ICD-10-CM | POA: Diagnosis not present

## 2021-01-16 DIAGNOSIS — S40011A Contusion of right shoulder, initial encounter: Secondary | ICD-10-CM | POA: Diagnosis not present

## 2021-01-16 DIAGNOSIS — S0990XA Unspecified injury of head, initial encounter: Secondary | ICD-10-CM | POA: Diagnosis present

## 2021-01-16 DIAGNOSIS — S0003XA Contusion of scalp, initial encounter: Secondary | ICD-10-CM | POA: Diagnosis not present

## 2021-01-16 DIAGNOSIS — W19XXXA Unspecified fall, initial encounter: Secondary | ICD-10-CM

## 2021-01-16 LAB — LACTIC ACID, PLASMA: Lactic Acid, Venous: 1.6 mmol/L (ref 0.5–1.9)

## 2021-01-16 LAB — CBC
HCT: 38.4 % (ref 36.0–46.0)
Hemoglobin: 11.9 g/dL — ABNORMAL LOW (ref 12.0–15.0)
MCH: 29.7 pg (ref 26.0–34.0)
MCHC: 31 g/dL (ref 30.0–36.0)
MCV: 95.8 fL (ref 80.0–100.0)
Platelets: 162 10*3/uL (ref 150–400)
RBC: 4.01 MIL/uL (ref 3.87–5.11)
RDW: 14.6 % (ref 11.5–15.5)
WBC: 9.2 10*3/uL (ref 4.0–10.5)
nRBC: 0 % (ref 0.0–0.2)

## 2021-01-16 LAB — PROTIME-INR
INR: 4 — ABNORMAL HIGH (ref 0.8–1.2)
Prothrombin Time: 38.7 seconds — ABNORMAL HIGH (ref 11.4–15.2)

## 2021-01-16 LAB — COMPREHENSIVE METABOLIC PANEL
ALT: 15 U/L (ref 0–44)
AST: 17 U/L (ref 15–41)
Albumin: 3.3 g/dL — ABNORMAL LOW (ref 3.5–5.0)
Alkaline Phosphatase: 74 U/L (ref 38–126)
Anion gap: 6 (ref 5–15)
BUN: 12 mg/dL (ref 8–23)
CO2: 35 mmol/L — ABNORMAL HIGH (ref 22–32)
Calcium: 9.2 mg/dL (ref 8.9–10.3)
Chloride: 98 mmol/L (ref 98–111)
Creatinine, Ser: 0.95 mg/dL (ref 0.44–1.00)
GFR, Estimated: >60 mL/min (ref >60)
Glucose, Bld: 168 mg/dL — ABNORMAL HIGH (ref 70–99)
Potassium: 4.6 mmol/L (ref 3.5–5.1)
Sodium: 139 mmol/L (ref 135–145)
Total Bilirubin: 0.4 mg/dL (ref 0.3–1.2)
Total Protein: 6.1 g/dL — ABNORMAL LOW (ref 6.5–8.1)

## 2021-01-16 LAB — I-STAT CHEM 8, ED
BUN: 13 mg/dL (ref 8–23)
Calcium, Ion: 1.21 mmol/L (ref 1.15–1.40)
Chloride: 96 mmol/L — ABNORMAL LOW (ref 98–111)
Creatinine, Ser: 0.9 mg/dL (ref 0.44–1.00)
Glucose, Bld: 166 mg/dL — ABNORMAL HIGH (ref 70–99)
HCT: 35 % — ABNORMAL LOW (ref 36.0–46.0)
Hemoglobin: 11.9 g/dL — ABNORMAL LOW (ref 12.0–15.0)
Potassium: 4.5 mmol/L (ref 3.5–5.1)
Sodium: 140 mmol/L (ref 135–145)
TCO2: 35 mmol/L — ABNORMAL HIGH (ref 22–32)

## 2021-01-16 LAB — ETHANOL: Alcohol, Ethyl (B): <10 mg/dL (ref <10)

## 2021-01-16 LAB — RESP PANEL BY RT-PCR (FLU A&B, COVID) ARPGX2
Influenza A by PCR: NEGATIVE
Influenza B by PCR: NEGATIVE
SARS Coronavirus 2 by RT PCR: NEGATIVE

## 2021-01-16 LAB — SAMPLE TO BLOOD BANK

## 2021-01-16 MED ORDER — ACETAMINOPHEN 500 MG PO TABS
1000.0000 mg | ORAL_TABLET | Freq: Once | ORAL | Status: DC
  Filled 2021-01-16: qty 2

## 2021-01-16 NOTE — ED Notes (Signed)
Attempted to call pt's husband to figure out transport home. Pt's husband didn't answer

## 2021-01-16 NOTE — ED Notes (Signed)
Pt transported to CT at this time by Trauma RN

## 2021-01-16 NOTE — ED Notes (Signed)
Pt back from imaging

## 2021-01-16 NOTE — ED Notes (Signed)
Pt's husband here to transport pt home

## 2021-01-16 NOTE — ED Provider Notes (Signed)
[facility] EMERGENCY DEPARTMENT Provider Note   CSN: [id number] Arrival date & time: 01/16/21  1100     History Chief Complaint  Patient presents with   Level 2 Fall on Thinners    Warfarin    Jennifer Lowe is a 80 y.o. female.  HPI Patient is anticoagulant on Coumadin for atrial fibrillation she had been in her recliner and transitioned to the bedside commode.  When she got up from the bedside commode she reports she felt dizzy and fell backwards.  She hit her head and shoulder on the bedside commode.  EMS history from husband as patient did not have any loss of consciousness.  Patient is unsure of loss of consciousness.  She denies headache.  She reports she has some discomfort in her right shoulder where she has a large bruise.  Otherwise no new pain.  She reports she always has abdominal pain and effort chronic issue.  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    No past medical history on file.  There are no problems to display for this patient.      OB History   No obstetric history on file.     No family history on file.     Home Medications Prior to Admission medications   Not on File    Allergies    Morphine and related  Review of Systems   Review of Systems 10 systems reviewed and negative except as per HPI Physical Exam Updated Vital Signs BP (!) 118/58   Pulse 74   Temp 98.4 F (36.9 C) (Oral)   Resp (!) 23   Ht 4\' 11"  (1.499 m)   Wt 92.5 kg   SpO2 100%   BMI 41.20 kg/m   Physical Exam Constitutional:      Comments: Alert nontoxic.  No respiratory distress.  Clear mental status.  HENT:     Head:     Comments: 2 cm hematoma to posterior scalp.  No active bleeding.  Superficial abrasion.    Nose: Nose normal.     Mouth/Throat:     Mouth: Mucous membranes are moist.     Pharynx: Oropharynx is clear.  Eyes:     Extraocular Movements: Extraocular movements intact.  Cardiovascular:      Comments: Irregularly irregular. Pulmonary:     Effort: Pulmonary effort is normal.     Breath sounds: Normal breath sounds.     Comments: 10 cm bruise to the right posterior shoulder.  No swelling at this time.  No deformity.  Chest wall otherwise nontender. Abdominal:     General: There is no distension.     Palpations: Abdomen is soft.     Comments: Patient Dors is mild generalized tenderness.  She reports this is chronic and does not feel different from baseline.  Musculoskeletal:     Cervical back: Neck supple.     Comments: No extremity deformities.  Patient can move all 4 extremities.  She does report some decreased range of motion due to prior stroke in the right lower extremity.  Patient's lower extremity motions also limited by body habitus.  Skin:    General: Skin is warm and dry.  Neurological:     Comments: Alert interactive.  Speech clear.  Answering questions and following commands without difficulty.  Spontaneous use of all 4 extremities  Psychiatric:        Mood and Affect: Mood normal.    ED Results / Procedures /  Treatments   Labs (all labs ordered are listed, but only abnormal results are displayed) Labs Reviewed  COMPREHENSIVE METABOLIC PANEL - Abnormal; Notable for the following components:      Result Value   CO2 35 (*)    Glucose, Bld 168 (*)    Total Protein 6.1 (*)    Albumin 3.3 (*)    All other components within normal limits  CBC - Abnormal; Notable for the following components:   Hemoglobin 11.9 (*)    All other components within normal limits  PROTIME-INR - Abnormal; Notable for the following components:   Prothrombin Time 38.7 (*)    INR 4.0 (*)    All other components within normal limits  I-STAT CHEM 8, ED - Abnormal; Notable for the following components:   Chloride 96 (*)    Glucose, Bld 166 (*)    TCO2 35 (*)    Hemoglobin 11.9 (*)    HCT 35.0 (*)    All other components within normal limits  RESP PANEL BY RT-PCR (FLU A&B, COVID)  ARPGX2  ETHANOL  LACTIC ACID, PLASMA  URINALYSIS, ROUTINE W REFLEX MICROSCOPIC  SAMPLE TO BLOOD BANK    EKG EKG Interpretation  Date/Time:  Sunday January 16 2021 11:16:45 EDT Ventricular Rate:  83 PR Interval:    QRS Duration: 89 QT Interval:  378 QTC Calculation: 445 R Axis:   -21 Text Interpretation: Atrial fibrillation Borderline left axis deviation Low voltage, precordial leads Consider anterior infarct agree, no acute ischemic appearance. no old comparison Confirmed by Arby Barrette [phone]) on 01/16/2021 11:34:34 AM  Radiology CT Head Wo Contrast  Result Date: 01/16/2021 CLINICAL DATA:  Head trauma.  Fall on blood thinners. EXAM: CT HEAD WITHOUT CONTRAST TECHNIQUE: Contiguous axial images were obtained from the base of the skull through the vertex without intravenous contrast. COMPARISON:  01/23/2020 FINDINGS: Brain: There is no evidence of an acute infarct, intracranial hemorrhage, mass, midline shift, or extra-axial fluid collection. There is mild to moderate central predominant cerebral atrophy. Hypodensities in the cerebral white matter bilaterally are unchanged and nonspecific but compatible with mild-to-moderate chronic small vessel ischemic disease. Vascular: Calcified atherosclerosis at the skull base. No hyperdense vessel. Skull: No fracture or suspicious osseous lesion. Sinuses/Orbits: Unremarkable orbits. The visualized paranasal sinuses and mastoid air cells are clear. Other: Small right parieto-occipital scalp hematoma. IMPRESSION: 1. No evidence of acute intracranial abnormality. 2. Small right parieto-occipital scalp hematoma. 3. Mild-to-moderate chronic small vessel ischemic disease and cerebral atrophy. Electronically Signed   By: Sebastian Ache M.D.   On: 01/16/2021 12:07   DG Pelvis Portable  Result Date: 01/16/2021 CLINICAL DATA:  Trauma fall with history of blood thinners. EXAM: PORTABLE PELVIS 1-2 VIEWS COMPARISON:  CT abdomen and pelvis from January 01, 2021. FINDINGS:  Osteopenia. Study limited by portable technique and patient body habitus. No sign of pelvic fracture. Signs of fusion in the lumbar spine incidentally noted. Soft tissues are unremarkable. Limited assessment of bilateral hips without acute process. IMPRESSION: No sign of pelvic fracture. Osteopenia and degenerative changes as above. Electronically Signed   By: Donzetta Kohut M.D.   On: 01/16/2021 12:34   DG Chest Port 1 View  Result Date: 01/16/2021 CLINICAL DATA:  Trauma, fall, patient on blood thinners. EXAM: PORTABLE CHEST 1 VIEW COMPARISON:  January 01, 2021.  December 22, 2020. FINDINGS: Image rotated to the RIGHT. Accounting for this cardiomediastinal contours and hilar structures are stable. Tracheal air column and major airways with normal radiographic appearance. No lobar consolidation.  No sign of pleural effusion. No sign of pneumothorax. On limited assessment no acute skeletal process. EKG leads project over the chest. IMPRESSION: No acute cardiopulmonary process. Electronically Signed   By: Donzetta Kohut M.D.   On: 01/16/2021 12:32    Procedures Procedures   Medications Ordered in ED Medications  acetaminophen (TYLENOL) tablet 1,000 mg (1,000 mg Oral Patient Refused/Not Given 01/16/21 1411)    ED Course  I have reviewed the triage vital signs and the nursing notes.  Pertinent labs & imaging results that were available during my care of the patient were reviewed by me and considered in my medical decision making (see chart for details).    MDM Rules/Calculators/A&P                         Patient has scalp hematoma with minor abrasion.  No laceration.  Patient is anticoagulated.  CT does not show any intracranial hemorrhage.  Patient is at baseline mental status.  Patient has contusion to the right shoulder.  Chest is nontender.  No evidence of pneumothorax or significant chest trauma.  Patient counseled for follow-up in 2 to 3 days and return precautions reviewed.  Final Clinical  Impression(s) / ED Diagnoses Final diagnoses:  Trauma  Fall, initial encounter  Hematoma of scalp, initial encounter  Contusion of right shoulder, initial encounter  Anticoagulated    Rx / DC Orders ED Discharge Orders     None        Arby Barrette, MD 01/16/21 1416

## 2021-01-16 NOTE — ED Notes (Signed)
Labs obtained at this time.

## 2021-01-16 NOTE — Progress Notes (Signed)
Orthopedic Tech Progress Note Patient Details:  Jennifer Lowe 30-Jul-1940 [id number] Level 2 trauma Patient ID: [name], female   DOB: 25-Oct-1940, 80 y.o.   MRN: [id number]  [name] 01/16/2021, 11:23 AM

## 2021-01-16 NOTE — ED Triage Notes (Signed)
Pt arrived to ED via [facility] from [facility] as a level 2 fall on thinners (warfarin). Pt lives at home w/ her husband. Pt reports at 0300, she was getting out of her recliner to use the bedside commode. Pt reports after using the bedside commode she was getting up and felt dizzy and fell. Pt initially reported LOC and is now denying LOC. Per EMS pt reported LOC and husband denies LOC. Pt was A&Ox3 w/ EMS, disoriented to time. Pt is A&Ox2 here (person, situation). Baseline unknown. Pt is on O2 3L at baseline. Pt has abrasion to R posterior of head, contusion to R shoulder and R abd. Pt in NAD at this time. VSS w/ EMS

## 2021-01-16 NOTE — ED Notes (Signed)
Attempted to call pt's husband and left HIPPA compliant message on voicemail

## 2021-01-16 NOTE — ED Notes (Signed)
Called xray to have them come do xrays

## 2021-01-16 NOTE — ED Notes (Signed)
Attempts x 2 to obtain blood for labs

## 2021-01-16 NOTE — Discharge Instructions (Addendum)
1.  Your CT scan does not show any evidence of bleeding trauma around the brain.  However, since you take Coumadin you must carefully observe for any signs of confusion, worsening headache, difficulty with your vision, balance, vomiting or other concerning symptoms.  Return immediately for recheck.  See your doctor for recheck in the next 2 to 3 days.  Apply a well wrapped ice pack to the swelling area on the back of your head.  May take extra strength Tylenol every 6 hours for pain. 2.  You also have bruises on your shoulder.  You may apply ice packs there as well.  Return if you develop shortness of breath, vomiting or other concerning symptoms.

## 2021-12-19 IMAGING — CT CT HEAD W/O CM
4 series · 17 of 47 positions shown, 19 images · non-contrast
Comparison: 01/23/2020

CLINICAL DATA: Head trauma.  Fall on blood thinners.

EXAM:
CT HEAD WITHOUT CONTRAST
TECHNIQUE: Contiguous axial images were obtained from the base of the skull
through the vertex without intravenous contrast.

[Series 3: head wo · axial · 0.44mm/px · z∈[-104,+16]mm · 7 of 33 slices shown, 9 images]
[im 5/33  brain]
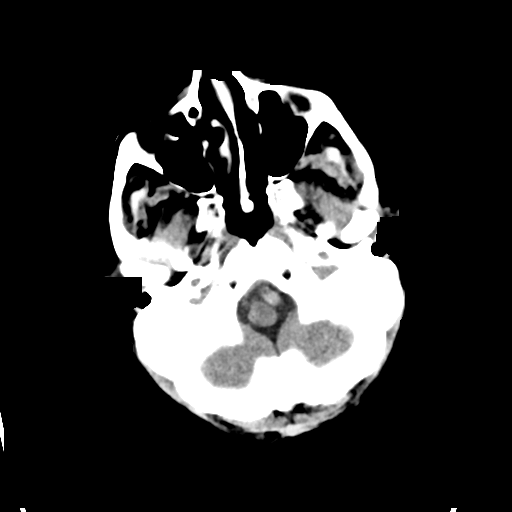
[im 5/33  bone]
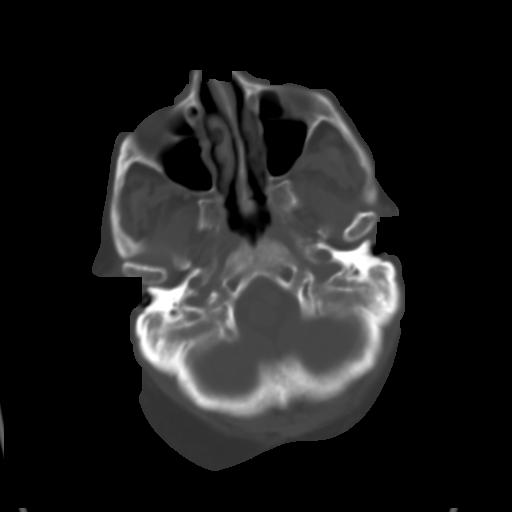
[im 9/33  brain]
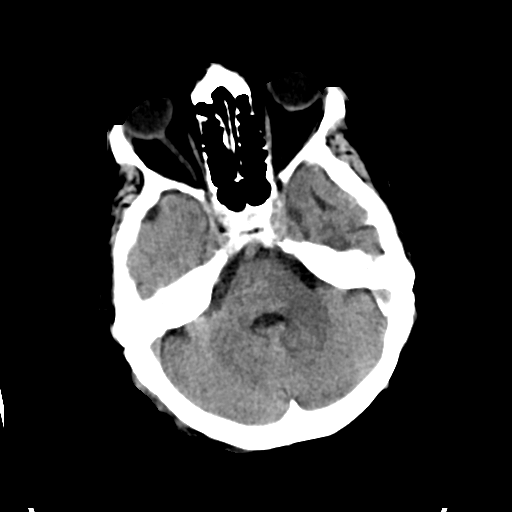
[im 13/33  brain]
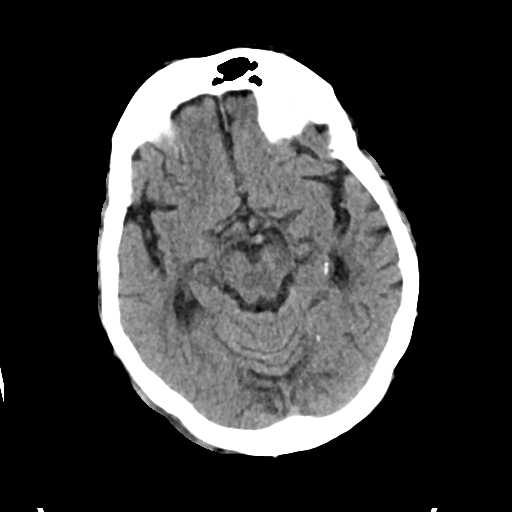
[im 17/33  brain]
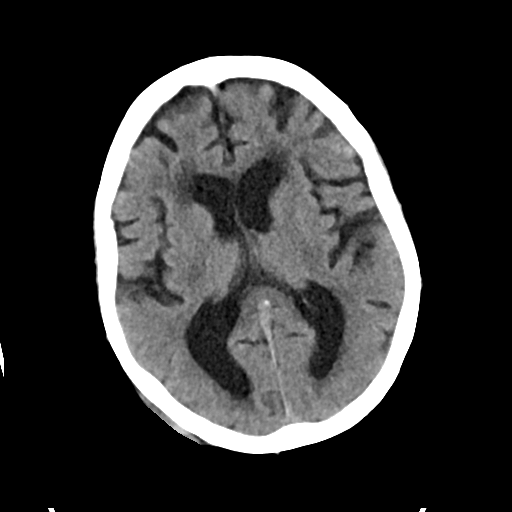
[im 21/33  brain]
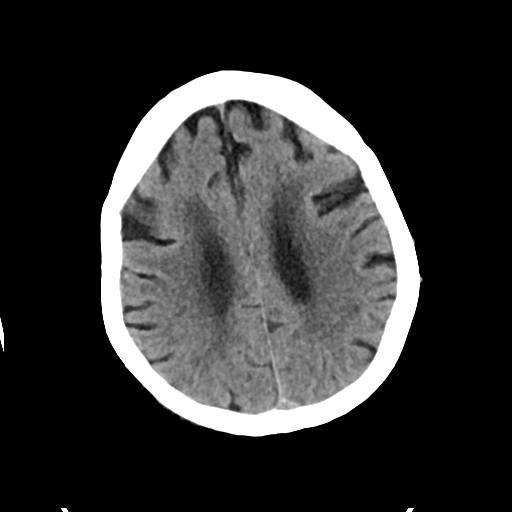
[im 21/33  bone]
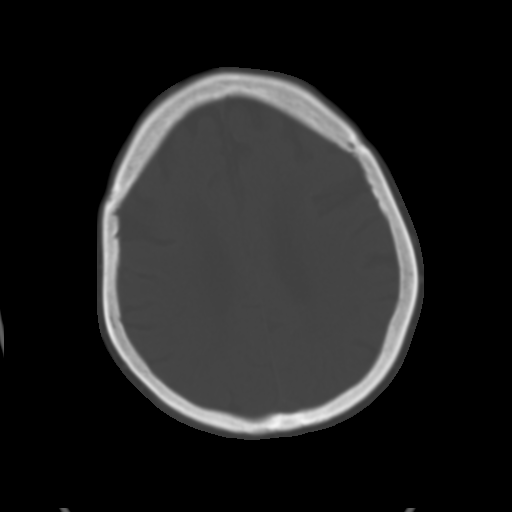
[im 25/33  brain]
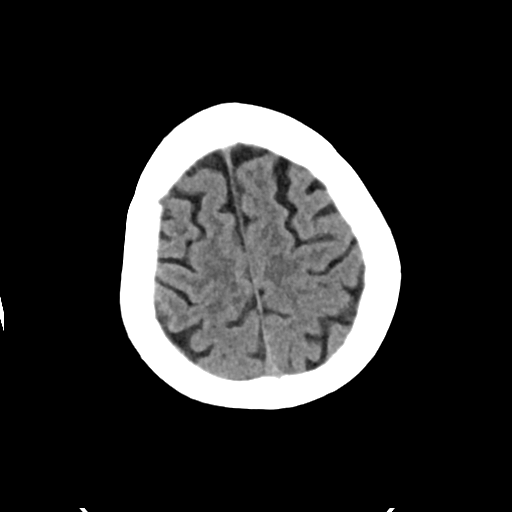
[im 29/33  brain]
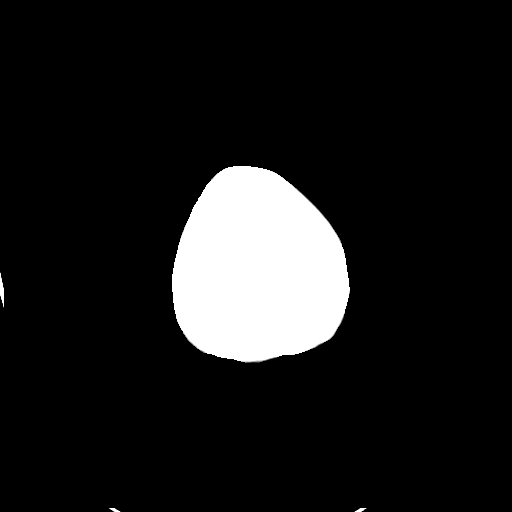

[Series 4: head bone · axial · 0.44mm/px · z∈[-108,-52]mm · 4 of 82 slices shown]
[im 9/82  bone]
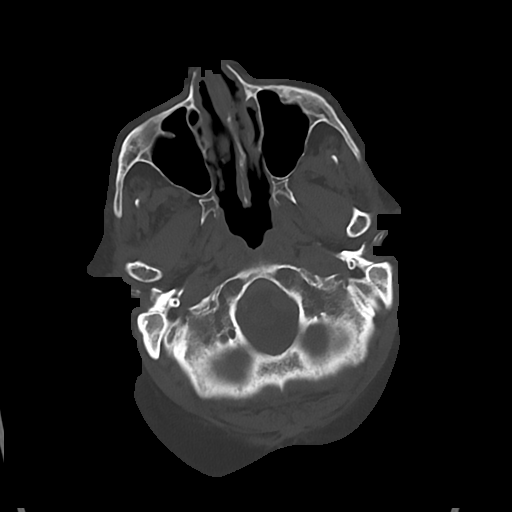
[im 17/82  bone]
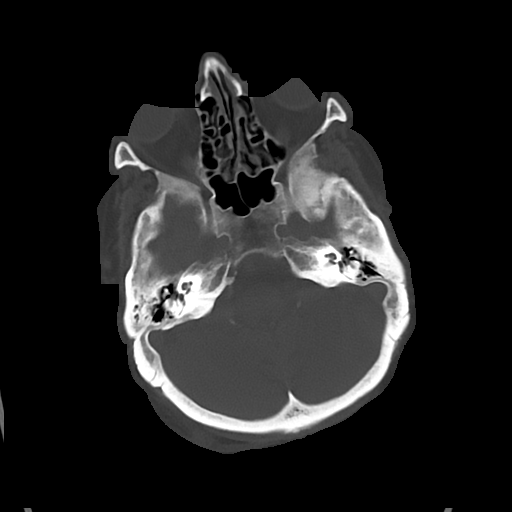
[im 25/82  bone]
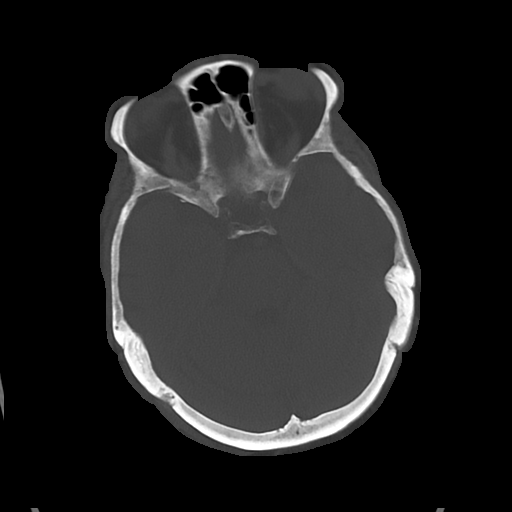
[im 37/82  bone]
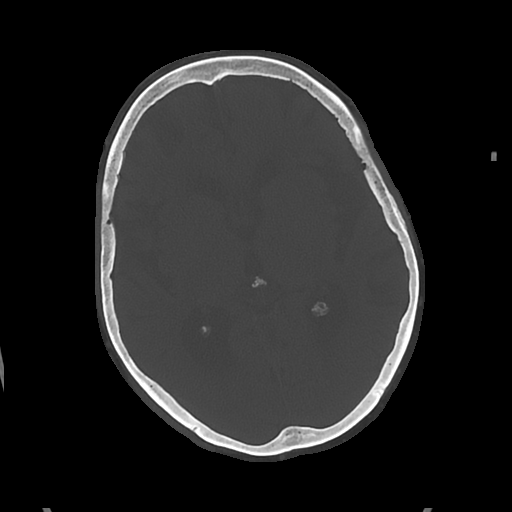

[Series 5: cor soft · coronal · 0.34mm/px · 3 of 70 slices shown]
[im 24/70  brain]
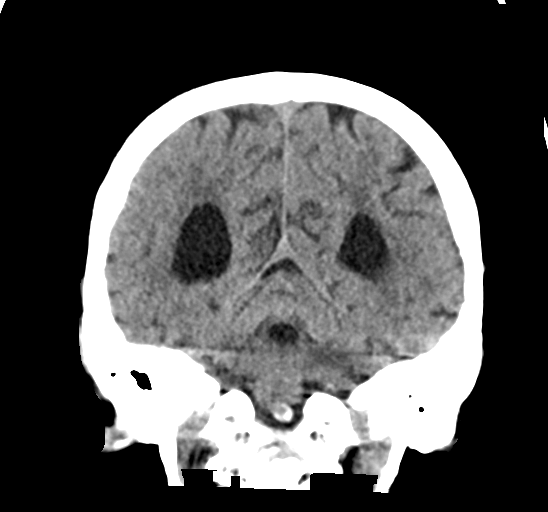
[im 31/70  brain]
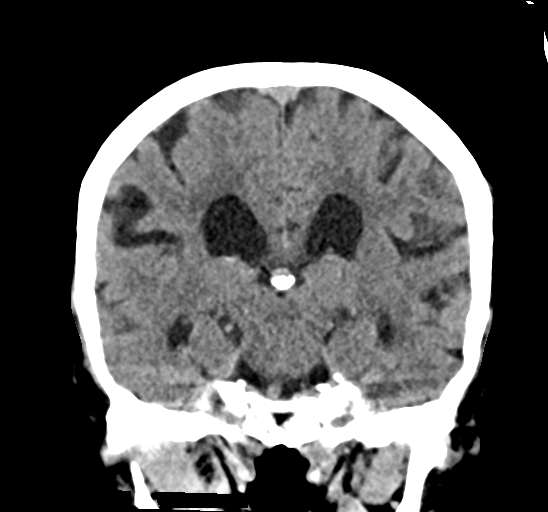
[im 39/70  brain]
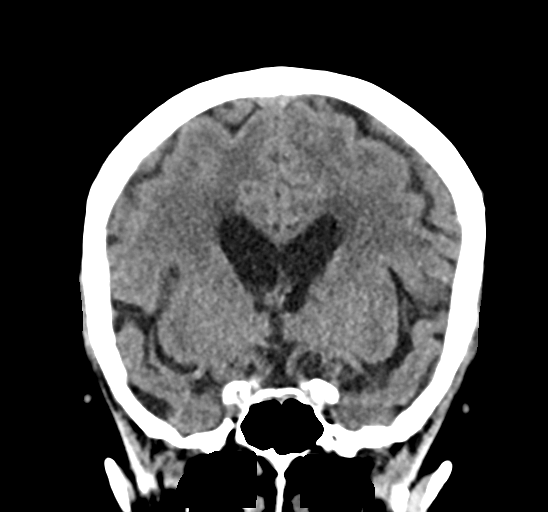

[Series 6: sag soft · sagittal · 0.34mm/px · 3 of 61 slices shown]
[im 21/61  brain]
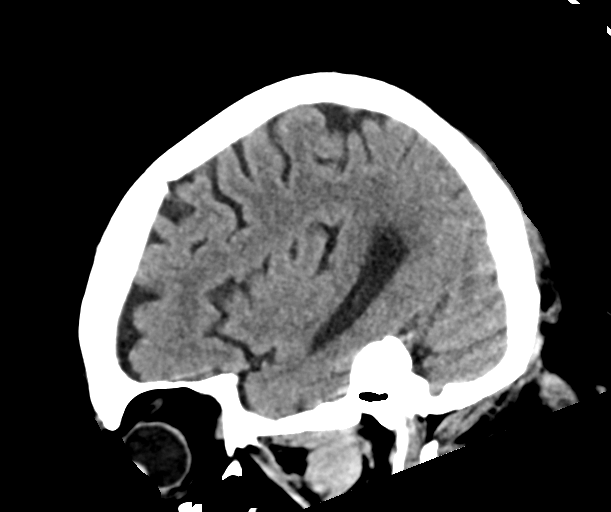
[im 31/61  brain]
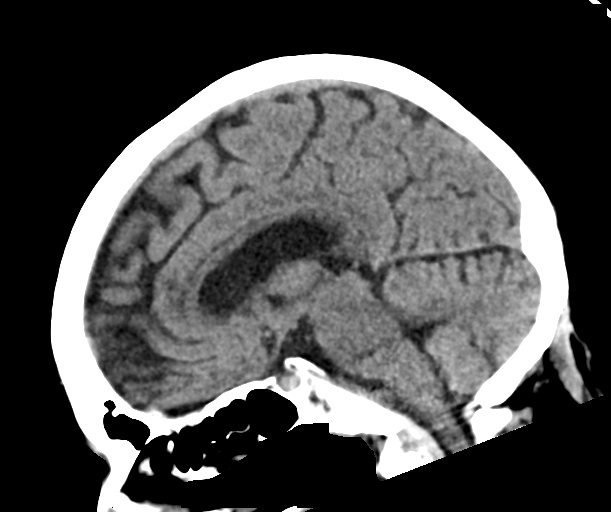
[im 41/61  brain]
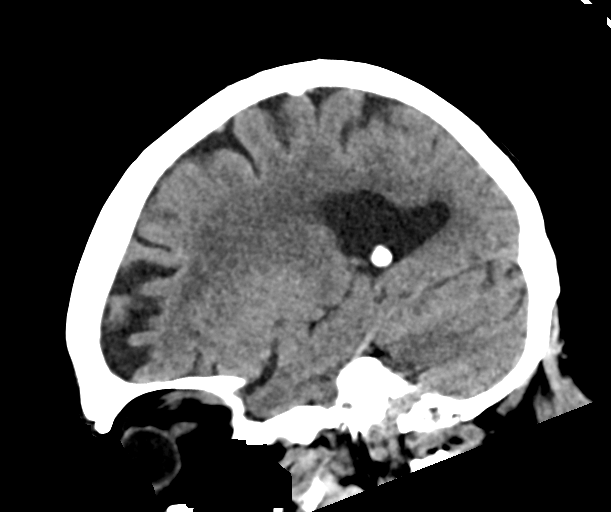

[17 of 47 positions shown; findings below may reference images not displayed]

FINDINGS: Brain: There is no evidence of an acute infarct, intracranial
hemorrhage, mass, midline shift, or extra-axial fluid collection.
There is mild to moderate central predominant cerebral atrophy.
Hypodensities in the cerebral white matter bilaterally are unchanged
and nonspecific but compatible with mild-to-moderate chronic small
vessel ischemic disease.

Vascular: Calcified atherosclerosis at the skull base. No hyperdense
vessel.

Skull: No fracture or suspicious osseous lesion.

Sinuses/Orbits: Unremarkable orbits. The visualized paranasal
sinuses and mastoid air cells are clear.

Other: Small right parieto-occipital scalp hematoma.
IMPRESSION: 1. No evidence of acute intracranial abnormality.
2. Small right parieto-occipital scalp hematoma.
3. Mild-to-moderate chronic small vessel ischemic disease and
cerebral atrophy.

## 2021-12-19 IMAGING — DX DG CHEST 1V PORT
1 series · 1 of 1 positions shown · non-contrast
Comparison: [DATE] [DATE], [DATE].  [DATE] [DATE], [DATE].

CLINICAL DATA: Trauma, fall, patient on blood thinners.

EXAM:
PORTABLE CHEST 1 VIEW

[chest]
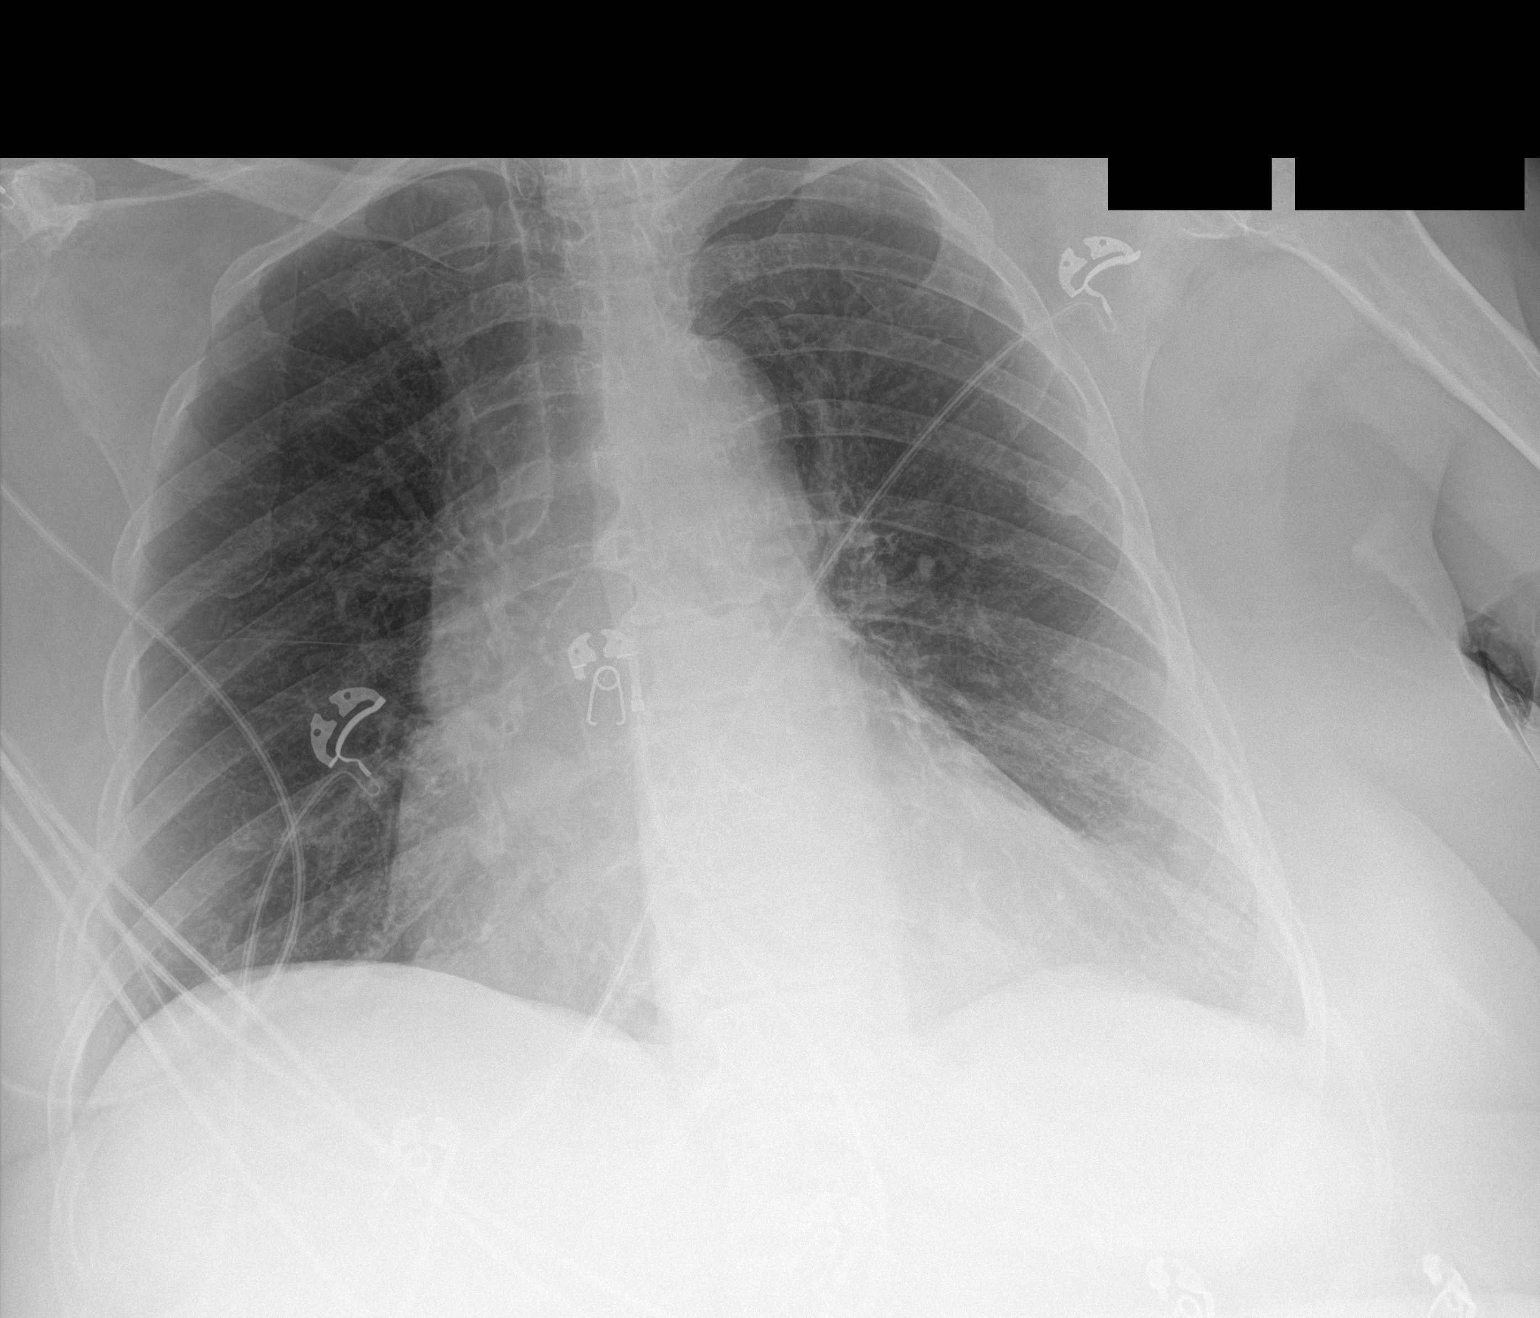

[1 of 1 positions shown; findings below may reference images not displayed]

FINDINGS: Image rotated to the RIGHT. Accounting for this cardiomediastinal
contours and hilar structures are stable. Tracheal air column and
major airways with normal radiographic appearance.

No lobar consolidation. No sign of pleural effusion. No sign of
pneumothorax.

On limited assessment no acute skeletal process. EKG leads project
over the chest.
IMPRESSION: No acute cardiopulmonary process.

## 2023-06-24 DEATH — deceased
